# Patient Record
Sex: Male | Born: 1970 | Race: White | Hispanic: No | Marital: Married | Smoking: Former smoker
Health system: Southern US, Community
[De-identification: ages and names within clinical notes are randomized; demographics above are authoritative.]

## PROBLEM LIST (undated history)

## (undated) DIAGNOSIS — K219 Gastro-esophageal reflux disease without esophagitis: Secondary | ICD-10-CM

## (undated) DIAGNOSIS — R7989 Other specified abnormal findings of blood chemistry: Secondary | ICD-10-CM

## (undated) DIAGNOSIS — I10 Essential (primary) hypertension: Secondary | ICD-10-CM

## (undated) DIAGNOSIS — M1990 Unspecified osteoarthritis, unspecified site: Secondary | ICD-10-CM

## (undated) HISTORY — PX: WISDOM TOOTH EXTRACTION: SHX21

---

## 2012-11-24 ENCOUNTER — Other Ambulatory Visit (HOSPITAL_COMMUNITY): Payer: Self-pay | Admitting: Orthopaedic Surgery

## 2012-12-08 ENCOUNTER — Other Ambulatory Visit (HOSPITAL_COMMUNITY): Payer: Self-pay | Admitting: Orthopaedic Surgery

## 2012-12-14 ENCOUNTER — Other Ambulatory Visit: Payer: Self-pay | Admitting: Orthopedic Surgery

## 2012-12-29 ENCOUNTER — Inpatient Hospital Stay: Admit: 2012-12-29 | Payer: Self-pay | Admitting: Orthopedic Surgery

## 2012-12-29 SURGERY — ARTHROPLASTY, HIP, TOTAL, ANTERIOR APPROACH
Anesthesia: Choice | Site: Hip | Laterality: Right

## 2013-01-07 ENCOUNTER — Encounter (HOSPITAL_COMMUNITY): Payer: Self-pay | Admitting: Pharmacy Technician

## 2013-01-17 ENCOUNTER — Encounter (HOSPITAL_COMMUNITY)
Admission: RE | Admit: 2013-01-17 | Discharge: 2013-01-17 | Disposition: A | Discharge status: Home / Self Care | Payer: BC Managed Care – PPO | Source: Ambulatory Visit | Attending: Orthopaedic Surgery | Admitting: Orthopaedic Surgery

## 2013-01-17 ENCOUNTER — Encounter (HOSPITAL_COMMUNITY): Payer: Self-pay

## 2013-01-17 DIAGNOSIS — Z01818 Encounter for other preprocedural examination: Secondary | ICD-10-CM | POA: Insufficient documentation

## 2013-01-17 DIAGNOSIS — Z01812 Encounter for preprocedural laboratory examination: Secondary | ICD-10-CM | POA: Insufficient documentation

## 2013-01-17 HISTORY — DX: Unspecified osteoarthritis, unspecified site: M19.90

## 2013-01-17 HISTORY — DX: Gastro-esophageal reflux disease without esophagitis: K21.9

## 2013-01-17 HISTORY — DX: Other specified abnormal findings of blood chemistry: R79.89

## 2013-01-17 LAB — URINALYSIS, ROUTINE W REFLEX MICROSCOPIC
Bilirubin Urine: NEGATIVE
GLUCOSE, UA: NEGATIVE mg/dL
HGB URINE DIPSTICK: NEGATIVE
Ketones, ur: NEGATIVE mg/dL
LEUKOCYTES UA: NEGATIVE
Nitrite: NEGATIVE
PROTEIN: NEGATIVE mg/dL
SPECIFIC GRAVITY, URINE: 1.026 (ref 1.005–1.030)
Urobilinogen, UA: 0.2 mg/dL (ref 0.0–1.0)
pH: 6 (ref 5.0–8.0)

## 2013-01-17 LAB — CBC
HCT: 47.6 % (ref 39.0–52.0)
HEMOGLOBIN: 16.8 g/dL (ref 13.0–17.0)
MCH: 30.1 pg (ref 26.0–34.0)
MCHC: 35.3 g/dL (ref 30.0–36.0)
MCV: 85.2 fL (ref 78.0–100.0)
Platelets: 276 10*3/uL (ref 150–400)
RBC: 5.59 MIL/uL (ref 4.22–5.81)
RDW: 12.5 % (ref 11.5–15.5)
WBC: 8.2 10*3/uL (ref 4.0–10.5)

## 2013-01-17 LAB — PROTIME-INR
INR: 0.98 (ref 0.00–1.49)
PROTHROMBIN TIME: 12.8 s (ref 11.6–15.2)

## 2013-01-17 LAB — BASIC METABOLIC PANEL
BUN: 14 mg/dL (ref 6–23)
CHLORIDE: 98 meq/L (ref 96–112)
CO2: 25 mEq/L (ref 19–32)
Calcium: 9.9 mg/dL (ref 8.4–10.5)
Creatinine, Ser: 0.84 mg/dL (ref 0.50–1.35)
GFR calc non Af Amer: >90 mL/min (ref >90)
Glucose, Bld: 89 mg/dL (ref 70–99)
POTASSIUM: 4.2 meq/L (ref 3.7–5.3)
SODIUM: 134 meq/L — AB (ref 137–147)

## 2013-01-17 LAB — SURGICAL PCR SCREEN
MRSA, PCR: POSITIVE — AB
Staphylococcus aureus: POSITIVE — AB

## 2013-01-17 LAB — APTT: aPTT: 31 seconds (ref 24–37)

## 2013-01-17 LAB — ABO/RH: ABO/RH(D): O POS

## 2013-01-17 NOTE — Progress Notes (Signed)
01-17-13 1610 Pt. 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 With Mupirocin ointment-please call rx. To [facility] [phone].Reminder pt. Will require contact Isolation during stay.Noah Gonzalez

## 2013-01-17 NOTE — Pre-Procedure Instructions (Addendum)
01-17-13 1315Fax to PCP-Dr. Linda Hedges, [facility] sent info on Stop/Bang Score=4 with PAT appointment today. W.Analissa Bayless,RN 01-17-13 1610 pt. And Dr. Trevor Mace office made awre of Positive MRSA PCR screen-will need tx. Mupirocin and require contact isolation. W.Nashton Belson,RN

## 2013-01-17 NOTE — Patient Instructions (Addendum)
[address] RAIDON SWANNER  01/17/2013     01/17/2013   Your procedure is scheduled on:  1-9 -2015  Report to [facility] at     [place]   AM.  Call this number if you have problems the morning of surgery: [phone]  Or Presurgical Testing [phone](Marcelline Temkin) For Living Will and/or Health Care Power Attorney Forms: please provide copy for your medical record,may bring AM of surgery(Forms should be already notarized -we do not provide this service).  Remember: Follow any bowel prep instructions per MD office. For Cpap use: Bring mask and tubing only.   Do not eat food:After Midnight.   Take these medicines the morning of surgery with A SIP OF WATER: Ultram. Omeprazole.   Do not wear jewelry, make-up or nail polish.  Do not wear lotions, powders, or perfumes. You may wear deodorant.  Do not shave 12 hours prior to first CHG shower(legs and under arms).(face and neck okay.)  Do not bring valuables to the hospital.(Hospital is not responsible for lost valuables).  Contacts, dentures or removable bridgework, body piercing, hair pins may not be worn into surgery.  Leave suitcase in the car. After surgery it may be brought to your room.  For patients admitted to the hospital, checkout time is 11:00 AM the day of discharge.   Patients discharged the day of surgery will not be allowed to drive home. Must have responsible person with you x 24 hours once discharged.  Name and phone number of your driver: Lacy Duverney [phone] cell  Special Instructions: CHG(Chlorhedine 4%-"Hibiclens","Betasept","Aplicare") Shower Use Special Wash: see special instructions.(avoid face and genitals)   Please read over the following fact sheets that you were given: MRSA Information, Blood Transfusion fact sheet, Incentive Spirometry Instruction.  Remember : Type/Screen "Blue armbands" - may not be removed once applied(would result in being retested AM of surgery, if removed).  Failure to follow  these instructions may result in Cancellation of your surgery.   Patient signature_______________________________________________________

## 2013-01-17 NOTE — Progress Notes (Signed)
Your Pt has screened with an elevated risk for obstructive sleep apnea using the Stop-Bang tool during a presurgical  Visit. A score of four or greater is an elevated risk. 

## 2013-01-21 ENCOUNTER — Inpatient Hospital Stay (HOSPITAL_COMMUNITY): Payer: BC Managed Care – PPO | Admitting: Anesthesiology

## 2013-01-21 ENCOUNTER — Inpatient Hospital Stay (HOSPITAL_COMMUNITY): Payer: BC Managed Care – PPO

## 2013-01-21 ENCOUNTER — Encounter (HOSPITAL_COMMUNITY): Payer: Self-pay | Admitting: *Deleted

## 2013-01-21 ENCOUNTER — Encounter (HOSPITAL_COMMUNITY)
Admission: RE | Disposition: A | Discharge status: Home Health | Payer: Self-pay | Source: Ambulatory Visit | Attending: Orthopaedic Surgery

## 2013-01-21 ENCOUNTER — Encounter (HOSPITAL_COMMUNITY): Payer: BC Managed Care – PPO | Admitting: Anesthesiology

## 2013-01-21 ENCOUNTER — Inpatient Hospital Stay (HOSPITAL_COMMUNITY)
Admission: RE | Admit: 2013-01-21 | Discharge: 2013-01-22 | DRG: 470 | Disposition: A | Discharge status: Home Health | Payer: BC Managed Care – PPO | Source: Ambulatory Visit | Attending: Orthopaedic Surgery | Admitting: Orthopaedic Surgery

## 2013-01-21 DIAGNOSIS — Z87891 Personal history of nicotine dependence: Secondary | ICD-10-CM

## 2013-01-21 DIAGNOSIS — M1610 Unilateral primary osteoarthritis, unspecified hip: Principal | ICD-10-CM | POA: Diagnosis present

## 2013-01-21 DIAGNOSIS — Z79899 Other long term (current) drug therapy: Secondary | ICD-10-CM

## 2013-01-21 DIAGNOSIS — M169 Osteoarthritis of hip, unspecified: Principal | ICD-10-CM | POA: Diagnosis present

## 2013-01-21 DIAGNOSIS — K219 Gastro-esophageal reflux disease without esophagitis: Secondary | ICD-10-CM | POA: Diagnosis present

## 2013-01-21 DIAGNOSIS — Z96649 Presence of unspecified artificial hip joint: Secondary | ICD-10-CM

## 2013-01-21 HISTORY — PX: TOTAL HIP ARTHROPLASTY: SHX124

## 2013-01-21 LAB — TYPE AND SCREEN
ABO/RH(D): O POS
ANTIBODY SCREEN: NEGATIVE

## 2013-01-21 SURGERY — ARTHROPLASTY, HIP, TOTAL, ANTERIOR APPROACH
Anesthesia: Spinal | Site: Hip | Laterality: Right

## 2013-01-21 MED ORDER — METOCLOPRAMIDE HCL 10 MG PO TABS: 5.0000 mg | ORAL_TABLET | Freq: Three times a day (TID) | ORAL | Status: DC | PRN

## 2013-01-21 MED ORDER — ONDANSETRON HCL 4 MG/2ML IJ SOLN
INTRAMUSCULAR | Status: AC
  Filled 2013-01-21: qty 2

## 2013-01-21 MED ORDER — ASPIRIN EC 325 MG PO TBEC
325.0000 mg | DELAYED_RELEASE_TABLET | Freq: Two times a day (BID) | ORAL | Status: DC
  Administered 2013-01-21 – 2013-01-22 (×2): 325 mg via ORAL
  Filled 2013-01-21 (×4): qty 1

## 2013-01-21 MED ORDER — OMEPRAZOLE MAGNESIUM 20 MG PO TBEC: 20.0000 mg | DELAYED_RELEASE_TABLET | Freq: Every day | ORAL | Status: DC

## 2013-01-21 MED ORDER — ONDANSETRON HCL 4 MG/2ML IJ SOLN: 4.0000 mg | Freq: Four times a day (QID) | INTRAMUSCULAR | Status: DC | PRN

## 2013-01-21 MED ORDER — HYDROMORPHONE HCL PF 1 MG/ML IJ SOLN
INTRAMUSCULAR | Status: AC
  Filled 2013-01-21: qty 1

## 2013-01-21 MED ORDER — PROPOFOL 10 MG/ML IV BOLUS
INTRAVENOUS | Status: AC
  Filled 2013-01-21: qty 20

## 2013-01-21 MED ORDER — ALUM & MAG HYDROXIDE-SIMETH 200-200-20 MG/5ML PO SUSP: 30.0000 mL | ORAL | Status: DC | PRN

## 2013-01-21 MED ORDER — LACTATED RINGERS IV SOLN: INTRAVENOUS | Status: DC

## 2013-01-21 MED ORDER — PROPOFOL 10 MG/ML IV BOLUS
INTRAVENOUS | Status: DC | PRN
  Administered 2013-01-21 (×5): 20 mg via INTRAVENOUS

## 2013-01-21 MED ORDER — HYDROMORPHONE HCL PF 1 MG/ML IJ SOLN
1.0000 mg | INTRAMUSCULAR | Status: DC | PRN
  Administered 2013-01-21 (×4): 1 mg via INTRAVENOUS
  Filled 2013-01-21 (×4): qty 1

## 2013-01-21 MED ORDER — CEFAZOLIN SODIUM-DEXTROSE 2-3 GM-% IV SOLR
INTRAVENOUS | Status: AC
  Filled 2013-01-21: qty 50

## 2013-01-21 MED ORDER — PANTOPRAZOLE SODIUM 40 MG PO TBEC
40.0000 mg | DELAYED_RELEASE_TABLET | Freq: Every day | ORAL | Status: DC
  Administered 2013-01-21 – 2013-01-22 (×2): 40 mg via ORAL
  Filled 2013-01-21 (×2): qty 1

## 2013-01-21 MED ORDER — METHOCARBAMOL 500 MG PO TABS
500.0000 mg | ORAL_TABLET | Freq: Four times a day (QID) | ORAL | Status: DC | PRN
  Administered 2013-01-21 – 2013-01-22 (×2): 500 mg via ORAL
  Filled 2013-01-21 (×3): qty 1

## 2013-01-21 MED ORDER — DIPHENHYDRAMINE HCL 12.5 MG/5ML PO ELIX: 12.5000 mg | ORAL_SOLUTION | ORAL | Status: DC | PRN

## 2013-01-21 MED ORDER — HYDROMORPHONE HCL PF 1 MG/ML IJ SOLN
0.2500 mg | INTRAMUSCULAR | Status: DC | PRN
  Administered 2013-01-21 (×2): 0.5 mg via INTRAVENOUS

## 2013-01-21 MED ORDER — METHOCARBAMOL 100 MG/ML IJ SOLN
500.0000 mg | Freq: Four times a day (QID) | INTRAVENOUS | Status: DC | PRN
  Administered 2013-01-21 – 2013-01-22 (×3): 500 mg via INTRAVENOUS
  Filled 2013-01-21 (×3): qty 5

## 2013-01-21 MED ORDER — ONDANSETRON HCL 4 MG/2ML IJ SOLN
INTRAMUSCULAR | Status: DC | PRN
  Administered 2013-01-21: 4 mg via INTRAVENOUS

## 2013-01-21 MED ORDER — ACETAMINOPHEN 325 MG PO TABS
650.0000 mg | ORAL_TABLET | Freq: Four times a day (QID) | ORAL | Status: DC | PRN
  Administered 2013-01-22: 650 mg via ORAL
  Filled 2013-01-21: qty 2

## 2013-01-21 MED ORDER — MIDAZOLAM HCL 5 MG/5ML IJ SOLN
INTRAMUSCULAR | Status: DC | PRN
  Administered 2013-01-21: 2 mg via INTRAVENOUS

## 2013-01-21 MED ORDER — PHENYLEPHRINE HCL 10 MG/ML IJ SOLN
INTRAMUSCULAR | Status: DC | PRN
  Administered 2013-01-21 (×2): 40 ug via INTRAVENOUS

## 2013-01-21 MED ORDER — OXYCODONE HCL 5 MG/5ML PO SOLN
5.0000 mg | Freq: Once | ORAL | Status: DC | PRN
  Filled 2013-01-21: qty 5

## 2013-01-21 MED ORDER — TRANEXAMIC ACID 100 MG/ML IV SOLN
1000.0000 mg | INTRAVENOUS | Status: AC
  Administered 2013-01-21: 1000 mg via INTRAVENOUS
  Filled 2013-01-21: qty 10

## 2013-01-21 MED ORDER — PROPOFOL INFUSION 10 MG/ML OPTIME
INTRAVENOUS | Status: DC | PRN
  Administered 2013-01-21: 100 ug/kg/min via INTRAVENOUS

## 2013-01-21 MED ORDER — MEPERIDINE HCL 50 MG/ML IJ SOLN: 6.2500 mg | INTRAMUSCULAR | Status: DC | PRN

## 2013-01-21 MED ORDER — OXYCODONE HCL 5 MG PO TABS
5.0000 mg | ORAL_TABLET | ORAL | Status: DC | PRN
  Administered 2013-01-21 – 2013-01-22 (×6): 10 mg via ORAL
  Filled 2013-01-21 (×6): qty 2

## 2013-01-21 MED ORDER — PHENYLEPHRINE 40 MCG/ML (10ML) SYRINGE FOR IV PUSH (FOR BLOOD PRESSURE SUPPORT)
PREFILLED_SYRINGE | INTRAVENOUS | Status: AC
  Filled 2013-01-21: qty 10

## 2013-01-21 MED ORDER — KETOROLAC TROMETHAMINE 30 MG/ML IJ SOLN
30.0000 mg | Freq: Once | INTRAMUSCULAR | Status: AC
  Administered 2013-01-21: 30 mg via INTRAVENOUS

## 2013-01-21 MED ORDER — BUPIVACAINE HCL (PF) 0.5 % IJ SOLN
INTRAMUSCULAR | Status: AC
  Filled 2013-01-21: qty 30

## 2013-01-21 MED ORDER — OXYCODONE HCL 5 MG PO TABS: 5.0000 mg | ORAL_TABLET | Freq: Once | ORAL | Status: DC | PRN

## 2013-01-21 MED ORDER — POLYETHYLENE GLYCOL 3350 17 G PO PACK: 17.0000 g | PACK | Freq: Every day | ORAL | Status: DC | PRN

## 2013-01-21 MED ORDER — CEFAZOLIN SODIUM-DEXTROSE 2-3 GM-% IV SOLR
2.0000 g | INTRAVENOUS | Status: AC
  Administered 2013-01-21: 2 g via INTRAVENOUS

## 2013-01-21 MED ORDER — ACETAMINOPHEN 650 MG RE SUPP: 650.0000 mg | Freq: Four times a day (QID) | RECTAL | Status: DC | PRN

## 2013-01-21 MED ORDER — MIDAZOLAM HCL 2 MG/2ML IJ SOLN
INTRAMUSCULAR | Status: AC
  Filled 2013-01-21: qty 2

## 2013-01-21 MED ORDER — ZOLPIDEM TARTRATE 5 MG PO TABS: 5.0000 mg | ORAL_TABLET | Freq: Every evening | ORAL | Status: DC | PRN

## 2013-01-21 MED ORDER — BUPIVACAINE HCL (PF) 0.5 % IJ SOLN
INTRAMUSCULAR | Status: DC | PRN
  Administered 2013-01-21: 3 mL

## 2013-01-21 MED ORDER — ONDANSETRON HCL 4 MG PO TABS: 4.0000 mg | ORAL_TABLET | Freq: Four times a day (QID) | ORAL | Status: DC | PRN

## 2013-01-21 MED ORDER — SODIUM CHLORIDE 0.9 % IV SOLN
INTRAVENOUS | Status: DC
  Administered 2013-01-21 – 2013-01-22 (×2): via INTRAVENOUS

## 2013-01-21 MED ORDER — METOCLOPRAMIDE HCL 5 MG/ML IJ SOLN: 5.0000 mg | Freq: Three times a day (TID) | INTRAMUSCULAR | Status: DC | PRN

## 2013-01-21 MED ORDER — PHENOL 1.4 % MT LIQD
1.0000 | OROMUCOSAL | Status: DC | PRN
  Filled 2013-01-21: qty 177

## 2013-01-21 MED ORDER — VANCOMYCIN HCL IN DEXTROSE 1-5 GM/200ML-% IV SOLN
1000.0000 mg | Freq: Two times a day (BID) | INTRAVENOUS | Status: AC
  Administered 2013-01-21: 1000 mg via INTRAVENOUS
  Filled 2013-01-21: qty 200

## 2013-01-21 MED ORDER — DOCUSATE SODIUM 100 MG PO CAPS
100.0000 mg | ORAL_CAPSULE | Freq: Two times a day (BID) | ORAL | Status: DC
  Administered 2013-01-21 – 2013-01-22 (×3): 100 mg via ORAL

## 2013-01-21 MED ORDER — LACTATED RINGERS IV SOLN
INTRAVENOUS | Status: DC | PRN
  Administered 2013-01-21 (×3): via INTRAVENOUS

## 2013-01-21 MED ORDER — FENTANYL CITRATE 0.05 MG/ML IJ SOLN
INTRAMUSCULAR | Status: AC
  Filled 2013-01-21: qty 2

## 2013-01-21 MED ORDER — MENTHOL 3 MG MT LOZG
1.0000 | LOZENGE | OROMUCOSAL | Status: DC | PRN
  Filled 2013-01-21: qty 9

## 2013-01-21 MED ORDER — KETOROLAC TROMETHAMINE 30 MG/ML IJ SOLN
INTRAMUSCULAR | Status: AC
  Filled 2013-01-21: qty 1

## 2013-01-21 MED ORDER — PROMETHAZINE HCL 25 MG/ML IJ SOLN: 6.2500 mg | INTRAMUSCULAR | Status: DC | PRN

## 2013-01-21 MED ORDER — SODIUM CHLORIDE 0.9 % IR SOLN
Status: DC | PRN
  Administered 2013-01-21: 1000 mL

## 2013-01-21 SURGICAL SUPPLY — 40 items
BAG ZIPLOCK 12X15 (MISCELLANEOUS) IMPLANT
BENZOIN TINCTURE PRP APPL 2/3 (GAUZE/BANDAGES/DRESSINGS) ×2 IMPLANT
BLADE SAW SGTL 18X1.27X75 (BLADE) ×2 IMPLANT
CAPT HIP PF COP ×2 IMPLANT
CELLS DAT CNTRL 66122 CELL SVR (MISCELLANEOUS) ×1 IMPLANT
DERMABOND ADVANCED (GAUZE/BANDAGES/DRESSINGS)
DERMABOND ADVANCED .7 DNX12 (GAUZE/BANDAGES/DRESSINGS) IMPLANT
DRAPE C-ARM 42X120 X-RAY (DRAPES) ×2 IMPLANT
DRAPE STERI IOBAN 125X83 (DRAPES) ×2 IMPLANT
DRAPE U-SHAPE 47X51 STRL (DRAPES) ×6 IMPLANT
DRSG AQUACEL AG ADV 3.5X10 (GAUZE/BANDAGES/DRESSINGS) ×2 IMPLANT
DURAPREP 26ML APPLICATOR (WOUND CARE) ×2 IMPLANT
ELECT BLADE TIP CTD 4 INCH (ELECTRODE) ×2 IMPLANT
ELECT REM PT RETURN 9FT ADLT (ELECTROSURGICAL) ×2
ELECTRODE REM PT RTRN 9FT ADLT (ELECTROSURGICAL) ×1 IMPLANT
FACESHIELD LNG OPTICON STERILE (SAFETY) ×8 IMPLANT
GAUZE XEROFORM 5X9 LF (GAUZE/BANDAGES/DRESSINGS) IMPLANT
GLOVE BIO SURGEON STRL SZ7.5 (GLOVE) ×16 IMPLANT
GLOVE BIOGEL PI IND STRL 8 (GLOVE) ×2 IMPLANT
GLOVE BIOGEL PI INDICATOR 8 (GLOVE) ×2
GLOVE ECLIPSE 8.0 STRL XLNG CF (GLOVE) ×2 IMPLANT
GOWN STRL REUS W/TWL XL LVL3 (GOWN DISPOSABLE) ×8 IMPLANT
HANDPIECE INTERPULSE COAX TIP (DISPOSABLE) ×1
KIT BASIN OR (CUSTOM PROCEDURE TRAY) ×2 IMPLANT
PACK TOTAL JOINT (CUSTOM PROCEDURE TRAY) ×2 IMPLANT
PADDING CAST COTTON 6X4 STRL (CAST SUPPLIES) ×2 IMPLANT
RTRCTR WOUND ALEXIS 18CM MED (MISCELLANEOUS) ×2
SET HNDPC FAN SPRY TIP SCT (DISPOSABLE) ×1 IMPLANT
STAPLER VISISTAT 35W (STAPLE) IMPLANT
STRIP CLOSURE SKIN 1/2X4 (GAUZE/BANDAGES/DRESSINGS) ×2 IMPLANT
SUT ETHIBOND NAB CT1 #1 30IN (SUTURE) ×4 IMPLANT
SUT ETHILON 3 0 PS 1 (SUTURE) IMPLANT
SUT MNCRL AB 4-0 PS2 18 (SUTURE) ×2 IMPLANT
SUT VIC AB 0 CT1 36 (SUTURE) ×2 IMPLANT
SUT VIC AB 1 CT1 36 (SUTURE) ×4 IMPLANT
SUT VIC AB 2-0 CT1 27 (SUTURE) ×2
SUT VIC AB 2-0 CT1 TAPERPNT 27 (SUTURE) ×2 IMPLANT
TOWEL OR 17X26 10 PK STRL BLUE (TOWEL DISPOSABLE) ×2 IMPLANT
TOWEL OR NON WOVEN STRL DISP B (DISPOSABLE) ×2 IMPLANT
TRAY FOLEY CATH 14FRSI W/METER (CATHETERS) ×2 IMPLANT

## 2013-01-21 NOTE — Progress Notes (Signed)
Dr. Lissa Hoard notified of patient complaining of right shoulder discomfort

## 2013-01-21 NOTE — Progress Notes (Signed)
Patient states right shoulder now better.

## 2013-01-21 NOTE — Anesthesia Procedure Notes (Addendum)
Spinal  Patient location during procedure: OR Start time: 01/21/2013 7:40 AM End time: 01/21/2013 7:48 AM Staffing CRNA/Resident: Anne Fu Performed by: resident/CRNA  Preanesthetic Checklist Completed: patient identified, site marked, surgical consent, pre-op evaluation, timeout performed, IV checked, risks and benefits discussed and monitors and equipment checked Spinal Block Patient position: sitting Prep: ChloraPrep Patient monitoring: heart rate, continuous pulse ox and blood pressure Approach: midline Location: L2-3 Injection technique: single-shot Needle Needle type: Sprotte  Needle gauge: 24 G Needle length: 9 cm Assessment Sensory level: T4 Additional Notes Expiration date of kit checked and confirmed. Patient tolerated procedure well, without complications.  Spinal X 1 attempt with noted clear CSF return easy aspiration and administration of medication. Noted T4 level on exam.

## 2013-01-21 NOTE — Progress Notes (Signed)
Dr. Lissa Hoard  Aware of spinal level and that patient is trying to move both legs- some movement- spinal level of L3

## 2013-01-21 NOTE — Anesthesia Postprocedure Evaluation (Signed)
Anesthesia Post Note  Patient: Noah Gonzalez  Procedure(s) Performed: Procedure(s) (LRB): RIGHT TOTAL HIP ARTHROPLASTY ANTERIOR APPROACH (Right)  Anesthesia type: Spinal  Patient location: PACU  Post pain: Pain level controlled  Post assessment: Post-op Vital signs reviewed  Last Vitals: BP 114/67  Pulse 73  Temp(Src) 37.1 C (Oral)  Resp 14  Ht 5\' 9"  (1.753 m)  Wt 244 lb 11.4 oz (111 kg)  BMI 36.12 kg/m2  SpO2 97%  Post vital signs: Reviewed  Level of consciousness: sedated  Complications: No apparent anesthesia complications

## 2013-01-21 NOTE — Evaluation (Signed)
Physical Therapy Evaluation Patient Details Name: Noah Gonzalez MRN: [id number] DOB: 1971/01/05 Today's Date: 01/21/2013 Time: 6720-9470 PT Time Calculation (min): 30 min  PT Assessment / Plan / Recommendation History of Present Illness     Clinical Impression  Pt s/p R THR presents with decreased R LE strength/ROM and post op pain limiting functional mobility.  Pt should progress to d/c home with family assist and HHPT follow up.    PT Assessment  Patient needs continued PT services    Follow Up Recommendations  Home health PT    Does the patient have the potential to tolerate intense rehabilitation      Barriers to Discharge        Equipment Recommendations  Rolling walker with 5" wheels    Recommendations for Other Services OT consult   Frequency 7X/week    Precautions / Restrictions Precautions Precautions: Fall Restrictions Weight Bearing Restrictions: No Other Position/Activity Restrictions: WBAT   Pertinent Vitals/Pain 6/10; premed, ice packs provided      Mobility  Bed Mobility Overal bed mobility: Needs Assistance Bed Mobility: Supine to Sit Supine to sit: Min assist General bed mobility comments: cues for sequence with min assist for R LE management Transfers Overall transfer level: Needs assistance Equipment used: Rolling walker (2 wheeled) Transfers: Sit to/from Stand Sit to Stand: Min assist General transfer comment: cues for LE management and use of UEs to self assist Ambulation/Gait Ambulation/Gait assistance: Min assist Ambulation Distance (Feet): 150 Feet Assistive device: Rolling walker (2 wheeled) Gait Pattern/deviations: Step-to pattern;Shuffle;Decreased step length - right;Decreased step length - left Gait velocity: decr General Gait Details: cues for posture, position from RW, stride length and initial sequence    Exercises Total Joint Exercises Ankle Circles/Pumps: AROM;15 reps;Both;Supine Quad Sets: AROM;Both;10 reps;Supine Heel  Slides: AAROM;20 reps;Right;Supine Hip ABduction/ADduction: AAROM;15 reps;Supine;Right   PT Diagnosis: Difficulty walking  PT Problem List: Decreased strength;Decreased range of motion;Decreased activity tolerance;Decreased mobility;Decreased knowledge of use of DME;Obesity;Pain PT Treatment Interventions: DME instruction;Gait training;Stair training;Functional mobility training;Therapeutic activities;Therapeutic exercise;Patient/family education     PT Goals(Current goals can be found in the care plan section) Acute Rehab PT Goals Patient Stated Goal: Be able to surf again PT Goal Formulation: With patient Time For Goal Achievement: 01/28/13 Potential to Achieve Goals: Good  Visit Information  Last PT Received On: 01/21/13 Assistance Needed: +1       Prior [place] expects to be discharged to:: Private residence Living Arrangements: Spouse/significant other Available Help at Discharge: Family Type of Home: House Home Access: Stairs to enter Technical brewer of Steps: 3 Entrance Stairs-Rails: Left Home Layout: Able to live on main level with bedroom/bathroom Home Equipment: Crutches Prior Function Level of Independence: Independent Communication Communication: No difficulties Dominant Hand: Right    Cognition  Cognition Arousal/Alertness: Awake/alert Behavior During Therapy: WFL for tasks assessed/performed Overall Cognitive Status: Within Functional Limits for tasks assessed    Extremity/Trunk Assessment Upper Extremity Assessment Upper Extremity Assessment: Overall WFL for tasks assessed Lower Extremity Assessment Lower Extremity Assessment: RLE deficits/detail RLE Deficits / Details: Hip strength 2+/5 with AAROM at hip to 90 flex and 25 abd Cervical / Trunk Assessment Cervical / Trunk Assessment: Normal   Balance    End of Session PT - End of Session Equipment Utilized During Treatment: Gait belt Activity Tolerance: Patient  tolerated treatment well Patient left: with call bell/phone within reach;in bed Nurse Communication: Mobility status  GP     Cambrie Sonnenfeld 01/21/2013, 5:26 PM

## 2013-01-21 NOTE — Transfer of Care (Signed)
Immediate Anesthesia Transfer of Care Note  Patient: Noah Gonzalez  Procedure(s) Performed: Procedure(s) (LRB): RIGHT TOTAL HIP ARTHROPLASTY ANTERIOR APPROACH (Right)  Patient Location: PACU  Anesthesia Type: Spinal  Level of Consciousness: sedated, patient cooperative and responds to stimulation  Airway & Oxygen Therapy: Patient Spontanous Breathing and Patient connected to face mask oxgen  Post-op Assessment: Report given to PACU RN and Post -op Vital signs reviewed and stable  Post vital signs: Reviewed and stable, noted T-12 level on exam in PACU denied pain on assessment.   Complications: No apparent anesthesia complications

## 2013-01-21 NOTE — Progress Notes (Signed)
X-RAY Results noted.

## 2013-01-21 NOTE — Progress Notes (Signed)
Portable AP Pelvis and Lateral Right Hip X-rays done. 

## 2013-01-21 NOTE — Brief Op Note (Signed)
01/21/2013  9:25 AM  PATIENT:  Cassell Smiles  43 y.o. male  PRE-OPERATIVE DIAGNOSIS:  Right hip osteoarthritis  POST-OPERATIVE DIAGNOSIS:  right hip osteoarthritis  PROCEDURE:  Procedure(s): RIGHT TOTAL HIP ARTHROPLASTY ANTERIOR APPROACH (Right)  SURGEON:  Surgeon(s) and Role:    * Mcarthur Rossetti, MD - Primary  PHYSICIAN ASSISTANT: Benita Stabile, PA-C  ANESTHESIA:   spinal  EBL:  Total I/O In: 1000 [I.V.:1000] Out: 400 [Urine:100; Blood:300]  BLOOD ADMINISTERED:none  DRAINS: none   LOCAL MEDICATIONS USED:  NONE  SPECIMEN:  No Specimen  DISPOSITION OF SPECIMEN:  N/A  COUNTS:  YES  TOURNIQUET:  * No tourniquets in log *  DICTATION: .Other Dictation: Dictation Number [id number]  PLAN OF CARE: Admit to inpatient   PATIENT DISPOSITION:  PACU - hemodynamically stable.   Delay start of Pharmacological VTE agent (>24hrs) due to surgical blood loss or risk of bleeding: no

## 2013-01-21 NOTE — H&P (Signed)
TOTAL HIP ADMISSION H&P  Patient is admitted for right total hip arthroplasty.  Subjective:  Chief Complaint: right hip pain  HPI: Noah Gonzalez, 43 y.o. male, has a history of pain and functional disability in the right hip(s) due to arthritis and patient has failed non-surgical conservative treatments for greater than 12 weeks to include NSAID's and/or analgesics, corticosteriod injections, use of assistive devices, weight reduction as appropriate and activity modification.  Onset of symptoms was gradual starting 5 years ago with rapidlly worsening course since that time.The patient noted no past surgery on the right hip(s).  Patient currently rates pain in the right hip at 10 out of 10 with activity. Patient has night pain, worsening of pain with activity and weight bearing, trendelenberg gait, pain that interfers with activities of daily living and pain with passive range of motion. Patient has evidence of subchondral cysts, subchondral sclerosis, periarticular osteophytes and joint space narrowing by imaging studies. This condition presents safety issues increasing the risk of falls.  There is no current active infection.  Patient Active Problem List   Diagnosis Date Noted  . Arthritis pain of right hip 01/21/2013   Past Medical History  Diagnosis Date  . GERD (gastroesophageal reflux disease)   . Arthritis     osteoarthritis-hips  . Low serum testosterone level     recent tx. done    Past Surgical History  Procedure Laterality Date  . Wisdom tooth extraction      Prescriptions prior to admission  Medication Sig Dispense Refill  . MELATONIN PO Take 1 tablet by mouth at bedtime.      Marland Kitchen testosterone cypionate (DEPOTESTOTERONE CYPIONATE) 200 MG/ML injection Inject into the muscle every 14 (fourteen) days.      . traMADol (ULTRAM) 50 MG tablet Take 50 mg by mouth every 6 (six) hours as needed for moderate pain or severe pain.      Marland Kitchen omeprazole (PRILOSEC OTC) 20 MG tablet Take 20 mg  by mouth daily. takes bedtime       No Known Allergies  History  Substance Use Topics  . Smoking status: Former Smoker -- 2 years    Types: Cigarettes    Quit date: 01/17/2005  . Smokeless tobacco: Former Systems developer    Types: Chew     Comment: quit 6 yrs ago  . Alcohol Use: Yes     Comment: rare social    History reviewed. No pertinent family history.   Review of Systems  Musculoskeletal: Positive for joint pain.  All other systems reviewed and are negative.    Objective:  Physical Exam  Constitutional: He is oriented to person, place, and time. He appears well-developed and well-nourished.  HENT:  Head: Normocephalic and atraumatic.  Eyes: EOM are normal. Pupils are equal, round, and reactive to light.  Neck: Normal range of motion. Neck supple.  Cardiovascular: Normal rate and regular rhythm.   Respiratory: Effort normal and breath sounds normal.  GI: Soft. Bowel sounds are normal.  Musculoskeletal:       Right hip: He exhibits decreased range of motion, decreased strength and bony tenderness.  Neurological: He is alert and oriented to person, place, and time.  Skin: Skin is warm and dry.  Psychiatric: He has a normal mood and affect.    Vital signs in last 24 hours: Temp:  [99.1 F (37.3 C)] 99.1 F (37.3 C) (01/09 0545) Pulse Rate:  [72] 72 (01/09 0545) Resp:  [16] 16 (01/09 0545) BP: (146)/(88) 146/88 mmHg (01/09 0545)  SpO2:  [96 %] 96 % (01/09 0545)  Labs:   There is no weight on file to calculate BMI.   Imaging Review Plain radiographs demonstrate severe degenerative joint disease of the right hip(s). The bone quality appears to be excellent for age and reported activity level.  Assessment/Plan:  End stage arthritis, right hip(s)  The patient history, physical examination, clinical judgement of the provider and imaging studies are consistent with end stage degenerative joint disease of the right hip(s) and total hip arthroplasty is deemed medically  necessary. The treatment options including medical management, injection therapy, arthroscopy and arthroplasty were discussed at length. The risks and benefits of total hip arthroplasty were presented and reviewed. The risks due to aseptic loosening, infection, stiffness, dislocation/subluxation,  thromboembolic complications and other imponderables were discussed.  The patient acknowledged the explanation, agreed to proceed with the plan and consent was signed. Patient is being admitted for inpatient treatment for surgery, pain control, PT, OT, prophylactic antibiotics, VTE prophylaxis, progressive ambulation and ADL's and discharge planning.The patient is planning to be discharged home with home health services

## 2013-01-21 NOTE — Anesthesia Preprocedure Evaluation (Signed)
Anesthesia Evaluation  Patient identified by MRN, date of birth, ID band Patient awake    Reviewed: Allergy & Precautions, H&P , NPO status , Patient's Chart, lab work & pertinent test results  Airway Mallampati: II TM Distance: >3 FB Neck ROM: Full    Dental  (+) Dental Advisory Given   Pulmonary former smoker,  breath sounds clear to auscultation        Cardiovascular negative cardio ROS  Rhythm:Regular Rate:Normal     Neuro/Psych negative neurological ROS  negative psych ROS   GI/Hepatic Neg liver ROS, GERD-  Medicated,  Endo/Other  negative endocrine ROS  Renal/GU negative Renal ROS     Musculoskeletal negative musculoskeletal ROS (+)   Abdominal   Peds  Hematology negative hematology ROS (+)   Anesthesia Other Findings   Reproductive/Obstetrics negative OB ROS                           Anesthesia Physical Anesthesia Plan  ASA: II  Anesthesia Plan: Spinal   Post-op Pain Management:    Induction: Intravenous  Airway Management Planned:   Additional Equipment:   Intra-op Plan:   Post-operative Plan:   Informed Consent: I have reviewed the patients History and Physical, chart, labs and discussed the procedure including the risks, benefits and alternatives for the proposed anesthesia with the patient or authorized representative who has indicated his/her understanding and acceptance.   Dental advisory given  Plan Discussed with: CRNA  Anesthesia Plan Comments:         Anesthesia Quick Evaluation

## 2013-01-21 NOTE — Progress Notes (Signed)
Dr. Lissa Hoard in and talked with patient.

## 2013-01-21 NOTE — Progress Notes (Signed)
patient states doctor reviewed risk/benefits of blood transfusion consent signed.

## 2013-01-22 LAB — CBC
HCT: 39.3 % (ref 39.0–52.0)
Hemoglobin: 13.5 g/dL (ref 13.0–17.0)
MCH: 30 pg (ref 26.0–34.0)
MCHC: 34.4 g/dL (ref 30.0–36.0)
MCV: 87.3 fL (ref 78.0–100.0)
Platelets: 237 10*3/uL (ref 150–400)
RBC: 4.5 MIL/uL (ref 4.22–5.81)
RDW: 12.6 % (ref 11.5–15.5)
WBC: 11.6 10*3/uL — ABNORMAL HIGH (ref 4.0–10.5)

## 2013-01-22 LAB — BASIC METABOLIC PANEL
BUN: 10 mg/dL (ref 6–23)
CALCIUM: 8.7 mg/dL (ref 8.4–10.5)
CO2: 27 mEq/L (ref 19–32)
Chloride: 95 mEq/L — ABNORMAL LOW (ref 96–112)
Creatinine, Ser: 0.95 mg/dL (ref 0.50–1.35)
Glucose, Bld: 98 mg/dL (ref 70–99)
Potassium: 4.1 mEq/L (ref 3.7–5.3)
SODIUM: 133 meq/L — AB (ref 137–147)

## 2013-01-22 MED ORDER — OXYCODONE-ACETAMINOPHEN 5-325 MG PO TABS: 1.0000 | ORAL_TABLET | ORAL | Status: DC | PRN

## 2013-01-22 MED ORDER — ASPIRIN 325 MG PO TBEC: 325.0000 mg | DELAYED_RELEASE_TABLET | Freq: Two times a day (BID) | ORAL | Status: DC

## 2013-01-22 MED ORDER — METHOCARBAMOL 500 MG PO TABS: 1000.0000 mg | ORAL_TABLET | Freq: Four times a day (QID) | ORAL | Status: DC | PRN

## 2013-01-22 NOTE — Op Note (Signed)
Noah Gonzalez, Noah Gonzalez NO.:  [id number]  MEDICAL RECORD NO.:  [id number]  LOCATION:  7741                         FACILITY:  [facility]  PHYSICIAN:  Lind Guest. Ninfa Linden, M.D.DATE OF BIRTH:  1970-09-17  DATE OF PROCEDURE:  01/21/2013 DATE OF DISCHARGE:                              OPERATIVE REPORT   PREOPERATIVE DIAGNOSES:  Severe end-stage arthritis and degenerative joint disease, right hip.  POSTOPERATIVE DIAGNOSES:  Severe end-stage arthritis and degenerative joint disease, right hip.  PROCEDURE:  Right total hip arthroplasty through direct anterior approach.  IMPLANTS:  DePuy Sector Gription acetabular component size 52 with apex hole eliminator guide and single screw, size 36+ 4 neutral polyethylene liner, size 11 Corail femoral component with varus offset (KLA), size 36+ 5 ceramic hip ball.  SURGEON:  Lind Guest. Ninfa Linden, M.D.  ASSISTANT:  Erskine Emery, PA-C.  ANESTHESIA:  Spinal.  ANTIBIOTICS:  2 g IV Ancef.  BLOOD LOSS:  300 mL.  COMPLICATIONS:  None.  INDICATIONS:  [name] is only a 43 year old gentleman with well documented evidence of severe end-stage arthritis of both his hips.  He has complete loss of the joint space on x-rays of both hips, periarticular osteophytes, subchondral sclerosis, and cystic changes. As the pain is more debilitating in the left than his right.  At this point, with the failure of conservative treatment, he wished to proceed with a total hip arthroplasty.  He understands the risks of this related to his age in terms of the implants pouring out.  He understands the risk of nerve and vessel injury, acute blood loss anemia, infection, fracture, dislocation and DVT.  He also understands the goals are improved mobility, decreased pain, and overall improved quality of life.  PROCEDURE DESCRIPTION:  After informed consent was obtained, appropriate right hip was marked.  He was brought to the operating room, a  spinal anesthesia was obtained while he was on the stretcher.  He was then placed in supine position on the stretcher and a Foley catheter was placed and traction boots were placed on both his feet.  Next, he was placed supine on the Hana fracture table with the perineal post in place and both legs in inline skeletal traction devices, but no traction applied.  His right operative hip was then prepped and draped with DuraPrep and sterile drapes.  A time-out was called to identify correct patient, correct right hip.  I then made an incision just inferior and posterior to the anterior-superior iliac spine and carried this slightly obliquely down the leg.  I dissected down to the tensor fascia lata and the tensor fascia was divided longitudinally.  I then proceeded with a direct anterior approach to the hip.  A Cobra retractor was placed around the lateral neck and around the medial neck, and I cauterized the lateral femoral circumflex vessels.  I opened up the hip capsule and found a large hip joint effusion, and placed the Cobra retractors within the hip capsule.  I then made my femoral neck cut with an oscillating saw just proximal to the lesser trochanter and completed this with an osteotome.  I placed a corkscrew guide in the femoral head and removed the femoral head  in its entirety and found a completely devoid of cartilage consistent with osteoarthritis and not avascular necrosis again given his age, which was quite severe.  I then cleaned the acetabulum of debris and placed a bent Hohmann medially and a Cobra retractor laterally.  I then began reaming from a size 42 reamer all the way up to a size 52 with all reamers under direct visualization and the last reamer under direct fluoroscopy, so we could also obtain our depth of reaming, our inclination, and anteversion.  Once I was pleased with this, I placed the real DePuy Sector Gription acetabular component size 52, the apex hole  eliminator guide and a single screw.  I then placed the real 36+ 4 neutral polyethylene liner and attention was turned to under the femur with the leg externally rotated to 100 degrees, extended and adducted.  I placed a Mueller retractor medially and a Holman retractor behind the greater trochanter.  I used a box cutting osteotome in the femoral canal and a rongeur to lateralize.  I then began broaching using the Corail broaching system from a size 8 up to a size 11.  With a size 11 and based on his anatomy, I tried a varus offset femoral neck and a 36+ 1.5 hip ball.  We brought the leg back over and up with traction and internal rotation reduced in the pelvis and showed just a slight bit of shock, but it was stable and external rotation past 90 degrees, but his leg lengths showed he was slightly short, so we decided to go with a longer hip ball.  We dislocated the hip and removed the trial components.  We then placed the real Corail femoral component size 11 with varus offset and the real 36+ 5 ceramic hip ball.  We reduced this back in the acetabulum and it was stable.  We copiously irrigated the soft tissue with normal saline solution using pulsatile lavage.  We closed the joint capsule with #1 Ethibond suture, followed by a running #1 Vicryl in the tensor fascia, 0 Vicryl in the deep tissue, 2-0 Vicryl in the subcutaneous tissue, and a 4-0 Monocryl subcuticular stitch.  Steri-Strips were applied as well as an Aquacel dressing.  He was taken off the Hana table into the recovery room in stable condition.  .  All final counts were correct.  There were no complications noted.  Of note, Erskine Emery, PA-C, was present during the entire case and his assistance was crucial for getting the case facilitated and completed throughout the case.     Lind Guest. Ninfa Linden, M.D.     CYB/MEDQ  D:  01/21/2013  T:  01/22/2013  Job:  [id number]

## 2013-01-22 NOTE — Evaluation (Signed)
Occupational Therapy Evaluation Patient Details Name: Noah Gonzalez MRN: [id number] DOB: Feb 18, 1970 Today's Date: 01/22/2013 Time: 1937-9024 OT Time Calculation (min): 18 min  OT Assessment / Plan / Recommendation History of present illness THA   Clinical Impression   Spasms limiting pt.  Education  Complete. Pt able to verbalize understanding of ADL activity s/p THA    OT Assessment  Patient does not need any further OT services    Follow Up Recommendations  No OT follow up       Equipment Recommendations  3 in 1 bedside comode          Precautions / Restrictions Restrictions Weight Bearing Restrictions: No Other Position/Activity Restrictions: WBAT       ADL  ADL Comments: Pt has been complaining of spasms.Pt was comfortable and did not want to move, but was open to OT at bed level. Pt was unable to dress him self prior to surgery and was open to using AE for dressing. Pt was educated on use of AE for dressing - reacher and socks.  OT did instruct pt where he could obtain and he plans on obtaining. Pt does want a 3 n 1 , but does feel his tub is such he can sit on side that is wider and get in tub.           Visit Information  Last OT Received On: 01/22/13 Assistance Needed: +1 History of Present Illness: THA       Prior [place] expects to be discharged to:: Private residence Living Arrangements: Spouse/significant other Available Help at Discharge: Family Type of Home: House Home Access: Stairs to enter Technical brewer of Steps: 3 Entrance Stairs-Rails: Left Home Layout: Able to live on main level with bedroom/bathroom Home Equipment: Crutches Prior Function Level of Independence: Independent Communication Communication: No difficulties Dominant Hand: Right         Vision/Perception Vision - History Patient Visual Report: No change from baseline   Cognition  Cognition Arousal/Alertness:  Awake/alert Behavior During Therapy: WFL for tasks assessed/performed Overall Cognitive Status: Within Functional Limits for tasks assessed    Extremity/Trunk Assessment Upper Extremity Assessment Upper Extremity Assessment: Overall WFL for tasks assessed     Mobility Bed Mobility Overal bed mobility: Needs Assistance Bed Mobility: Sit to Supine Supine to sit: Min assist General bed mobility comments: assit to help lift RLE for causes pain in R hip area Transfers Overall transfer level: Modified independent Equipment used: Rolling walker (2 wheeled) Transfers: Sit to/from Stand Sit to Stand: Modified independent (Device/Increase time)           End of Session OT - End of Session Activity Tolerance: Patient limited by pain;Other (comment) (spasms) Patient left: in bed;with family/visitor present  GO     Brilliant, Thereasa Parkin 01/22/2013, 2:12 PM

## 2013-01-22 NOTE — Progress Notes (Signed)
Pt stable, scripts, d/c instructions, and equipment given with no questions/concerns voiced by pt or wife.  Pt transported via wheelchair to private vehicle by NT and family.

## 2013-01-22 NOTE — Progress Notes (Signed)
Physical Therapy Treatment Patient Details Name: Noah Gonzalez MRN: [id number] DOB: Oct 19, 1970 Today's Date: 01/22/2013 Time: 2500-3704 PT Time Calculation (min): 45 min  PT Assessment / Plan / Recommendation  History of Present Illness R direct THA    PT Comments   Pt tolerated session well , did a lot of verbal education with car transfer, how to progress with exercises. Reviewed verbally and with demonstration on exercises as well as stair training. Handout given. Pt feels great with progression  And really emphasized the rest and healing phase as well. Reviewed some quad stretches gently with avoiding too much hip extension and no external rotation. Pt aware and agree. Pt safe to DC home from our standpoint.   Follow Up Recommendations  Home health PT     Does the patient have the potential to tolerate intense rehabilitation     Barriers to Discharge        Equipment Recommendations  Rolling walker with 5" wheels    Recommendations for Other Services    Frequency 7X/week   Progress towards PT Goals    Plan      Precautions / Restrictions Restrictions Weight Bearing Restrictions: No Other Position/Activity Restrictions: WBAT   Pertinent Vitals/Pain Had lots of pian last night, today much better about 4/10 , just "feel tight" in quad and around incision.     Mobility  Bed Mobility Overal bed mobility: Needs Assistance Bed Mobility: Sit to Supine Supine to sit: Min assist General bed mobility comments: assit to help lift RLE for causes pain in R hip area Transfers Overall transfer level: Modified independent Equipment used: Rolling walker (2 wheeled) Transfers: Sit to/from Stand Sit to Stand: Modified independent (Device/Increase time) Ambulation/Gait Ambulation/Gait assistance: Supervision Ambulation Distance (Feet): 150 Feet Assistive device: Rolling walker (2 wheeled) Gait Pattern/deviations: Step-to pattern Gait velocity: decr General Gait Details: decreased  hip flesion due to pain with hip flexion in swing phase. Had internally popping sensation and he asked MD in hallway concerning this and states it should get bettter with time and no worries at this time.     Exercises     PT Diagnosis:    PT Problem List:   PT Treatment Interventions:     PT Goals (current goals can now be found in the care plan section)    Visit Information  Last PT Received On: 01/22/13 Assistance Needed: +1 History of Present Illness: R direct THA     Subjective Data      Cognition  Cognition Arousal/Alertness: Awake/alert Behavior During Therapy: WFL for tasks assessed/performed Overall Cognitive Status: Within Functional Limits for tasks assessed    Balance     End of Session PT - End of Session Equipment Utilized During Treatment: Gait belt Activity Tolerance: Patient tolerated treatment well Patient left: with call bell/phone within reach;in bed Nurse Communication: Mobility status   GP     Clide Dales 01/22/2013, 10:38 AM Clide Dales, PT Pager: [phone] 01/22/2013

## 2013-01-22 NOTE — Discharge Summary (Signed)
Patient ID: Noah Gonzalez MRN: [id number] DOB/AGE: February 19, 1970 43 y.o.  Admit date: 01/21/2013 Discharge date: 01/22/2013  Admission Diagnoses:  Principal Problem:   Arthritis pain of right hip Active Problems:   Status post THR (total hip replacement)   Discharge Diagnoses:  Same  Past Medical History  Diagnosis Date  . GERD (gastroesophageal reflux disease)   . Arthritis     osteoarthritis-hips  . Low serum testosterone level     recent tx. done    Surgeries: Procedure(s): RIGHT TOTAL HIP ARTHROPLASTY ANTERIOR APPROACH on 01/21/2013   Consultants:    Discharged Condition: Improved  Hospital Course: Noah Gonzalez is an 43 y.o. male who was admitted 01/21/2013 for operative treatment ofArthritis pain of hip. Patient has severe unremitting pain that affects sleep, daily activities, and work/hobbies. After pre-op clearance the patient was taken to the operating room on 01/21/2013 and underwent  Procedure(s): RIGHT TOTAL HIP ARTHROPLASTY ANTERIOR APPROACH.    Patient was given perioperative antibiotics: Anti-infectives   Start     Dose/Rate Route Frequency Ordered Stop   01/21/13 1300  vancomycin (VANCOCIN) IVPB 1000 mg/200 mL premix     1,000 mg 200 mL/hr over 60 Minutes Intravenous Every 12 hours 01/21/13 1210 01/21/13 1650   01/21/13 0615  ceFAZolin (ANCEF) IVPB 2 g/50 mL premix     2 g 100 mL/hr over 30 Minutes Intravenous On call to O.R. 01/21/13 0615 01/21/13 0802       Patient was given sequential compression devices, early ambulation, and chemoprophylaxis to prevent DVT.  Patient benefited maximally from hospital stay and there were no complications.    Recent vital signs: Patient Vitals for the past 24 hrs:  BP Temp Temp src Pulse Resp SpO2 Height Weight  01/22/13 0524 126/71 mmHg 100.4 F (38 C) Oral 85 16 95 % - -  01/22/13 0215 147/70 mmHg 98.3 F (36.8 C) Oral 87 16 95 % - -  01/21/13 2344 - - - - 16 - - -  01/21/13 2206 133/74 mmHg 98.6 F (37 C)  Oral 88 16 98 % - -  01/21/13 1918 - - - - 16 - - -  01/21/13 1835 124/84 mmHg 99.4 F (37.4 C) - 89 16 95 % - -  01/21/13 1600 - - - - 16 97 % - -  01/21/13 1500 108/73 mmHg 98.7 F (37.1 C) Oral 75 14 98 % - -  01/21/13 1412 114/67 mmHg 98.8 F (37.1 C) - 73 14 97 % - -  01/21/13 1300 123/73 mmHg 97.9 F (36.6 C) - 80 16 97 % 5\' 9"  (1.753 m) 111 kg (244 lb 11.4 oz)  01/21/13 1148 114/65 mmHg 98 F (36.7 C) - 70 16 98 % - -  01/21/13 1115 108/60 mmHg 97.8 F (36.6 C) - 64 13 98 % - -  01/21/13 1104 - - - 66 12 98 % - -  01/21/13 1102 - - - 67 14 98 % - -  01/21/13 1100 105/62 mmHg - - 66 14 100 % - -  01/21/13 1057 - - - 69 23 99 % - -  01/21/13 1050 - - - 66 16 98 % - -  01/21/13 1045 118/68 mmHg 97.8 F (36.6 C) - 68 18 98 % - -  01/21/13 1030 122/67 mmHg 97.8 F (36.6 C) - 67 17 99 % - -  01/21/13 1015 111/67 mmHg - - 67 17 99 % - -  01/21/13 1000 106/61 mmHg - - 79  18 100 % - -     Recent laboratory studies:  Recent Labs  01/22/13 0450  WBC 11.6*  HGB 13.5  HCT 39.3  PLT 237  NA 133*  K 4.1  CL 95*  CO2 27  BUN 10  CREATININE 0.95  GLUCOSE 98  CALCIUM 8.7     Discharge Medications:     Medication List    STOP taking these medications       traMADol 50 MG tablet  Commonly known as:  ULTRAM      TAKE these medications       aspirin 325 MG EC tablet  Take 1 tablet (325 mg total) by mouth 2 (two) times daily after a meal.     MELATONIN PO  Take 1 tablet by mouth at bedtime.     methocarbamol 500 MG tablet  Commonly known as:  ROBAXIN  Take 2 tablets (1,000 mg total) by mouth every 6 (six) hours as needed for muscle spasms.     omeprazole 20 MG tablet  Commonly known as:  PRILOSEC OTC  Take 20 mg by mouth daily. takes bedtime     oxyCODONE-acetaminophen 5-325 MG per tablet  Commonly known as:  ROXICET  Take 1-2 tablets by mouth every 4 (four) hours as needed for severe pain.     testosterone cypionate 200 MG/ML injection  Commonly known  as:  DEPOTESTOTERONE CYPIONATE  Inject into the muscle every 14 (fourteen) days.        Diagnostic Studies: Dg Hip Complete Right  01/21/2013   CLINICAL DATA:  Right hip prosthesis  EXAM: RIGHT HIP - COMPLETE 2+ VIEW  COMPARISON:  None.  FINDINGS: New right hip prosthesis is well-seated and aligned. No acute fracture or evidence of an operative complication.  IMPRESSION: Well-positioned right hip prosthesis   Electronically Signed   By: Lajean Manes M.D.   On: 01/21/2013 09:32   Dg Pelvis Portable  01/21/2013   CLINICAL DATA:  Postoperative study from right total hip joint replacement.  EXAM: PORTABLE PELVIS 1-2 VIEWS  COMPARISON:  None.  FINDINGS: The patient has undergone right total hip joint prosthesis placement. Radiographic position of the prosthetic components is good. The bony pelvis exhibits no acute abnormality. Mild narrowing of the superior aspect of the left hip joint space is noted.  IMPRESSION: The patient has undergone right total hip joint prosthesis placement. The radiographic appearance of the prosthetic joint is good where visualized.   Electronically Signed   By: David  Martinique   On: 01/21/2013 10:12   Dg Hip Portable 1 View Right  01/21/2013   CLINICAL DATA:  Right total hip replacement  EXAM: PORTABLE RIGHT HIP - 1 VIEW  COMPARISON:  None.  FINDINGS: Cross-table lateral view of the right hip demonstrates a total right hip arthroplasty without failure or complication. There is no dislocation. There are postsurgical changes in the surrounding soft tissues.  IMPRESSION: Right total hip arthroplasty without failure or complication.   Electronically Signed   By: Kathreen Devoid   On: 01/21/2013 10:12   Dg C-arm 61-120 Min-no Report  01/21/2013   CLINICAL DATA: hip   C-ARM 61-120 MINUTES  Fluoroscopy was utilized by the requesting physician.  No radiographic  interpretation.     Disposition: to home      Discharge Orders   Future Orders Complete By Expires   Call MD / Call 911  As  directed    Comments:     If you experience chest pain  or shortness of breath, CALL 911 and be transported to the hospital emergency room.  If you develope a fever above 101 F, pus (white drainage) or increased drainage or redness at the wound, or calf pain, call your surgeon's office.   Constipation Prevention  As directed    Comments:     Drink plenty of fluids.  Prune juice may be helpful.  You may use a stool softener, such as Colace (over the counter) 100 mg twice a day.  Use MiraLax (over the counter) for constipation as needed.   Diet - low sodium heart healthy  As directed    Discharge instructions  As directed    Comments:     Increase activities as comfort allows. Expect thigh pain and swelling. You can get your actual dressing wet in the shower. You can remove your actual dressing next Friday 1/16 and start getting your actual incision wet. Leave the steri-strips in place.   Discharge patient  As directed    Increase activity slowly as tolerated  As directed       Follow-up Information   Follow up with Mcarthur Rossetti, MD In 2 weeks.   Specialty:  Orthopedic Surgery   Contact information:   [place] [address] [phone]        Signed: Mcarthur Rossetti 01/22/2013, 9:45 AM

## 2013-01-22 NOTE — Progress Notes (Signed)
Subjective: 1 Day Post-Op Procedure(s) (LRB): RIGHT TOTAL HIP ARTHROPLASTY ANTERIOR APPROACH (Right) Patient reports pain as moderate.    Objective: Vital signs in last 24 hours: Temp:  [97.8 F (36.6 C)-100.4 F (38 C)] 100.4 F (38 C) (01/10 0524) Pulse Rate:  [64-89] 85 (01/10 0524) Resp:  [12-23] 16 (01/10 0524) BP: (105-147)/(60-84) 126/71 mmHg (01/10 0524) SpO2:  [95 %-100 %] 95 % (01/10 0524) Weight:  [111 kg (244 lb 11.4 oz)] 111 kg (244 lb 11.4 oz) (01/09 1300)  Intake/Output from previous day: 01/09 0701 - 01/10 0700 In: 4250 [P.O.:600; I.V.:3430; IV Piggyback:220] Out: 3100 [Urine:2800; Blood:300] Intake/Output this shift: Total I/O In: 360 [P.O.:360] Out: -    Recent Labs  01/22/13 0450  HGB 13.5    Recent Labs  01/22/13 0450  WBC 11.6*  RBC 4.50  HCT 39.3  PLT 237    Recent Labs  01/22/13 0450  NA 133*  K 4.1  CL 95*  CO2 27  BUN 10  CREATININE 0.95  GLUCOSE 98  CALCIUM 8.7   No results found for this basename: LABPT, INR,  in the last 72 hours  Sensation intact distally Intact pulses distally Dorsiflexion/Plantar flexion intact Incision: scant drainage  Assessment/Plan: 1 Day Post-Op Procedure(s) (LRB): RIGHT TOTAL HIP ARTHROPLASTY ANTERIOR APPROACH (Right) Discharge home with home health  Noah Gonzalez 01/22/2013, 9:41 AM

## 2013-01-22 NOTE — Care Management Note (Addendum)
    Page 1 of 2   01/22/2013     5:21:07 PM   CARE MANAGEMENT NOTE 01/22/2013  Patient:  Noah Gonzalez, Noah Gonzalez   Account Number:  [id number]  Date Initiated:  01/22/2013  Documentation initiated by:  Dessa Phi  Subjective/Objective Assessment:   43 Y/O M ADMITTED W/R HIP PAIN.     Action/Plan:   FROM HOME.   Anticipated DC Date:  01/22/2013   Anticipated DC Plan:  [place]  CM consult      Choice offered to / List presented to:  C-1 Patient   DME arranged  [facility]      DME agency  [place] arranged  [facility].   Status of service:  Completed, signed off Medicare Important Message given?   (If response is "NO", the following Medicare IM given date fields will be blank) Date Medicare IM given:   Date Additional Medicare IM given:    Discharge Disposition:  [place] [place]  Per UR Regulation:    If discussed at Long Length of Stay Meetings, dates discussed:    Comments:  01/22/13 Eann Cleland RN,BSN NCM 18 [id number] NOTED 3N1 RECOMMENDED,PATIENT HAD ALREADY D/C,NOTIFIED MD TO PLACE ORDER.[facility] REP [place] PATIENT TO CONFIRM THE NEED ,& HAVE DELIVERED IF NEEDED.NURSE AWARE.  HHPT.[facility] CHOSEN FOR HHPT.TC [facility] REP JAMIE AWARE OF HHPT ORDER,& D/C.RW TO BE BROUGHT TO RM PER [facility] DME.NO FURTHER D/C NEEDS OR ORDERS.

## 2013-01-24 ENCOUNTER — Encounter (HOSPITAL_COMMUNITY): Payer: Self-pay | Admitting: Orthopaedic Surgery

## 2013-01-24 NOTE — Progress Notes (Signed)
Utilization review completed.  

## 2013-09-15 ENCOUNTER — Emergency Department (HOSPITAL_COMMUNITY)
Admission: EM | Admit: 2013-09-15 | Discharge: 2013-09-15 | Disposition: A | Discharge status: Home / Self Care | Payer: BC Managed Care – PPO | Attending: Emergency Medicine | Admitting: Emergency Medicine

## 2013-09-15 ENCOUNTER — Emergency Department (HOSPITAL_COMMUNITY): Payer: BC Managed Care – PPO

## 2013-09-15 ENCOUNTER — Encounter (HOSPITAL_COMMUNITY): Payer: Self-pay | Admitting: Emergency Medicine

## 2013-09-15 DIAGNOSIS — S298XXA Other specified injuries of thorax, initial encounter: Secondary | ICD-10-CM | POA: Insufficient documentation

## 2013-09-15 DIAGNOSIS — Z87891 Personal history of nicotine dependence: Secondary | ICD-10-CM | POA: Insufficient documentation

## 2013-09-15 DIAGNOSIS — M169 Osteoarthritis of hip, unspecified: Secondary | ICD-10-CM | POA: Diagnosis not present

## 2013-09-15 DIAGNOSIS — K219 Gastro-esophageal reflux disease without esophagitis: Secondary | ICD-10-CM | POA: Insufficient documentation

## 2013-09-15 DIAGNOSIS — Y9355 Activity, bike riding: Secondary | ICD-10-CM | POA: Insufficient documentation

## 2013-09-15 DIAGNOSIS — Z79899 Other long term (current) drug therapy: Secondary | ICD-10-CM | POA: Insufficient documentation

## 2013-09-15 DIAGNOSIS — Y92410 Unspecified street and highway as the place of occurrence of the external cause: Secondary | ICD-10-CM | POA: Insufficient documentation

## 2013-09-15 DIAGNOSIS — M1610 Unilateral primary osteoarthritis, unspecified hip: Secondary | ICD-10-CM | POA: Insufficient documentation

## 2013-09-15 DIAGNOSIS — S20219A Contusion of unspecified front wall of thorax, initial encounter: Secondary | ICD-10-CM | POA: Diagnosis not present

## 2013-09-15 DIAGNOSIS — Z791 Long term (current) use of non-steroidal anti-inflammatories (NSAID): Secondary | ICD-10-CM | POA: Diagnosis not present

## 2013-09-15 DIAGNOSIS — S20211A Contusion of right front wall of thorax, initial encounter: Secondary | ICD-10-CM

## 2013-09-15 MED ORDER — HYDROCODONE-ACETAMINOPHEN 5-325 MG PO TABS
2.0000 | ORAL_TABLET | Freq: Once | ORAL | Status: AC
  Administered 2013-09-15: 2 via ORAL
  Filled 2013-09-15: qty 2

## 2013-09-15 MED ORDER — NAPROXEN 500 MG PO TABS: 500.0000 mg | ORAL_TABLET | Freq: Two times a day (BID) | ORAL | Status: DC

## 2013-09-15 MED ORDER — HYDROCODONE-ACETAMINOPHEN 5-325 MG PO TABS: 2.0000 | ORAL_TABLET | ORAL | Status: DC | PRN
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
## 2013-09-15 NOTE — Discharge Instructions (Signed)
Please call your doctor for a followup appointment within 24-48 hours. When you talk to your doctor please let them know that you were seen in the emergency department and have them acquire all of your records so that they can discuss the findings with you and formulate a treatment plan to fully care for your new and ongoing problems. ° °

## 2014-11-03 DIAGNOSIS — I10 Essential (primary) hypertension: Secondary | ICD-10-CM | POA: Insufficient documentation

## 2014-11-03 DIAGNOSIS — E669 Obesity, unspecified: Secondary | ICD-10-CM | POA: Insufficient documentation

## 2014-11-03 DIAGNOSIS — E291 Testicular hypofunction: Secondary | ICD-10-CM | POA: Insufficient documentation

## 2014-11-06 DIAGNOSIS — R635 Abnormal weight gain: Secondary | ICD-10-CM | POA: Insufficient documentation

## 2014-11-20 DIAGNOSIS — N529 Male erectile dysfunction, unspecified: Secondary | ICD-10-CM | POA: Insufficient documentation

## 2014-12-31 IMAGING — CR DG HIP 1V PORT*R*
1 series · 1 of 1 positions shown · non-contrast
Comparison: None.

CLINICAL DATA: Right total hip replacement

EXAM:
PORTABLE RIGHT HIP - 1 VIEW

[AP]
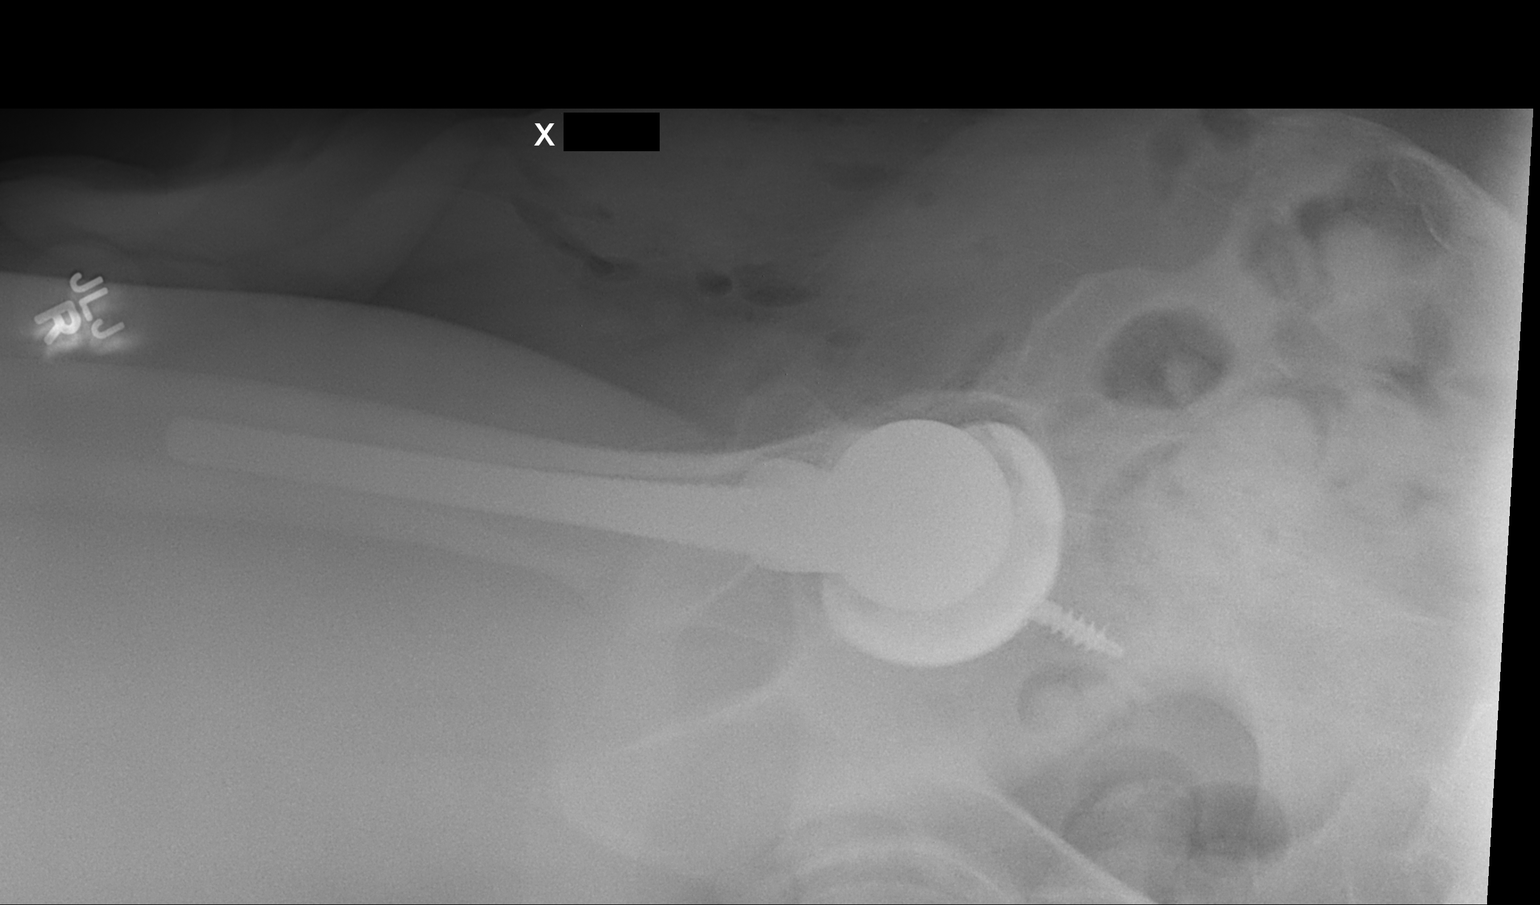

[1 of 1 positions shown; findings below may reference images not displayed]

FINDINGS: Cross-table lateral view of the right hip demonstrates a total right
hip arthroplasty without failure or complication. There is no
dislocation. There are postsurgical changes in the surrounding soft
tissues.
IMPRESSION: Right total hip arthroplasty without failure or complication.

## 2014-12-31 IMAGING — CR DG PORTABLE PELVIS
1 series · 1 of 1 positions shown · non-contrast
Comparison: None.

CLINICAL DATA: Postoperative study from right total hip joint
replacement.

EXAM:
PORTABLE PELVIS 1-2 VIEWS

[AP]
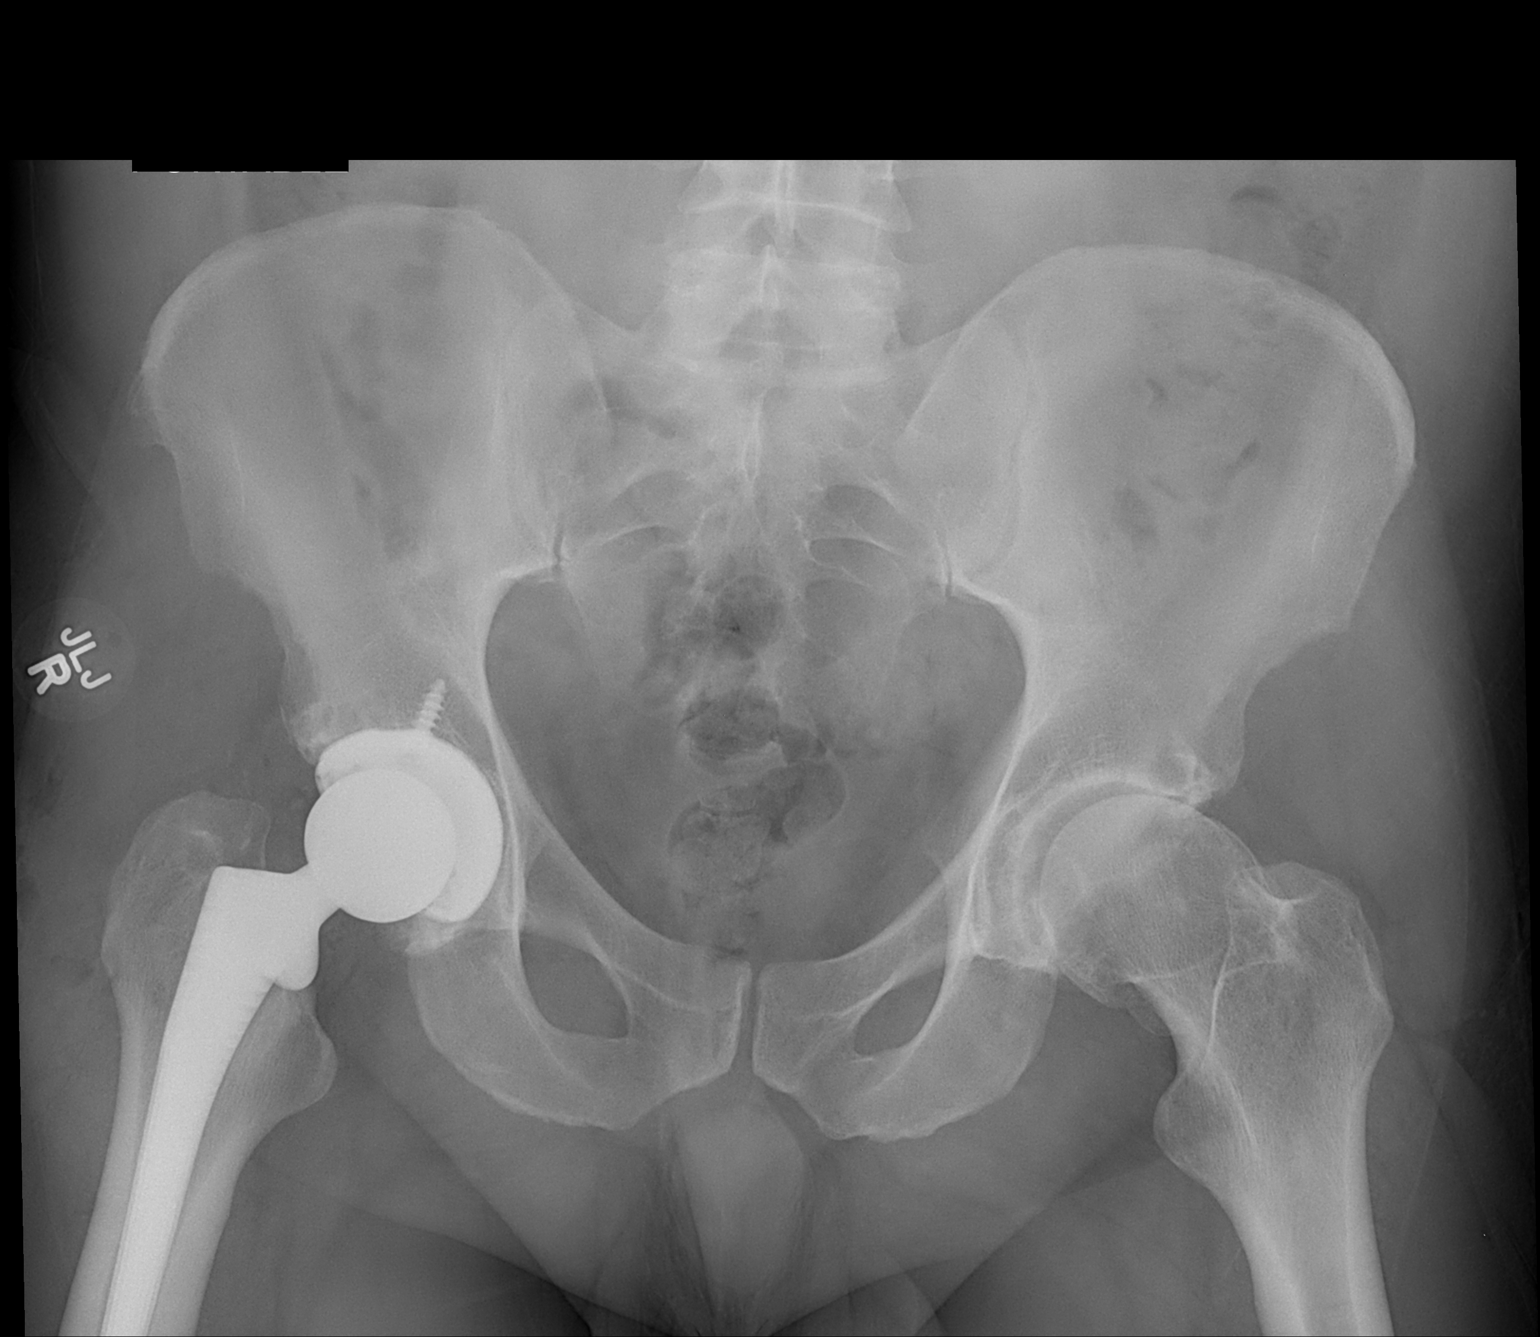

[1 of 1 positions shown; findings below may reference images not displayed]

FINDINGS: The patient has undergone right total hip joint prosthesis
placement. Radiographic position of the prosthetic components is
good. The bony pelvis exhibits no acute abnormality. Mild narrowing
of the superior aspect of the left hip joint space is noted.
IMPRESSION: The patient has undergone right total hip joint prosthesis
placement. The radiographic appearance of the prosthetic joint is
good where visualized.

## 2015-02-02 DIAGNOSIS — N62 Hypertrophy of breast: Secondary | ICD-10-CM | POA: Insufficient documentation

## 2015-08-24 ENCOUNTER — Ambulatory Visit (HOSPITAL_COMMUNITY)
Admission: EM | Admit: 2015-08-24 | Discharge: 2015-08-24 | Disposition: A | Discharge status: Home / Self Care | Payer: 59 | Attending: Family Medicine | Admitting: Family Medicine

## 2015-08-24 ENCOUNTER — Encounter (HOSPITAL_COMMUNITY): Payer: Self-pay | Admitting: *Deleted

## 2015-08-24 DIAGNOSIS — S0181XA Laceration without foreign body of other part of head, initial encounter: Secondary | ICD-10-CM

## 2015-08-24 NOTE — ED Provider Notes (Signed)
[place]    CSN: [id number] Arrival date & time: 08/24/15  1811  First Provider Contact:  First MD Initiated Contact with Patient 08/24/15 1826        History   Chief Complaint Chief Complaint  Patient presents with  . Laceration    HPI Noah Gonzalez is a 45 y.o. male.    Laceration  Location:  Face Facial laceration location:  L eyelid Length:  1.2 Depth:  Cutaneous Quality: straight   Bleeding: controlled   Time since incident:  1 hour Laceration mechanism:  Blunt object Pain details:    Quality:  Sharp   Severity:  Mild Foreign body present:  No foreign bodies Relieved by:  Nothing Worsened by:  Nothing Ineffective treatments:  None tried Tetanus status:  Up to date Associated symptoms: no swelling     Past Medical History:  Diagnosis Date  . Arthritis    osteoarthritis-hips  . GERD (gastroesophageal reflux disease)   . Low serum testosterone level    recent tx. done    Patient Active Problem List   Diagnosis Date Noted  . Arthritis pain of right hip 01/21/2013  . Status post THR (total hip replacement) 01/21/2013    Past Surgical History:  Procedure Laterality Date  . TOTAL HIP ARTHROPLASTY Right 01/21/2013   Procedure: RIGHT TOTAL HIP ARTHROPLASTY ANTERIOR APPROACH;  Surgeon: Mcarthur Rossetti, MD;  Location: [facility];  Service: Orthopedics;  Laterality: Right;  . WISDOM TOOTH EXTRACTION         Home Medications    Prior to Admission medications   Medication Sig Start Date End Date Taking? Authorizing Provider  HYDROcodone-acetaminophen (NORCO/VICODIN) 5-325 MG per tablet Take 2 tablets by mouth every 4 (four) hours as needed. 09/15/13   Noemi Chapel, MD  MELATONIN PO Take 1 tablet by mouth at bedtime.    Historical Provider, MD  naproxen (NAPROSYN) 500 MG tablet Take 1 tablet (500 mg total) by mouth 2 (two) times daily with a meal. 09/15/13   Noemi Chapel, MD  omeprazole (PRILOSEC OTC) 20 MG tablet Take 20 mg by mouth  daily. takes bedtime    Historical Provider, MD  testosterone cypionate (DEPOTESTOTERONE CYPIONATE) 200 MG/ML injection Inject into the muscle every 14 (fourteen) days.    Historical Provider, MD  triamterene-hydrochlorothiazide (MAXZIDE-25) 37.5-25 MG per tablet Take 1 tablet by mouth daily.    Historical Provider, MD    Family History No family history on file.  Social History Social History  Substance Use Topics  . Smoking status: Former Smoker    Years: 2.00    Types: Cigarettes    Quit date: 01/17/2005  . Smokeless tobacco: Former Systems developer    Types: Chew     Comment: quit 6 yrs ago  . Alcohol use Yes     Comment: rare social     Allergies   Review of patient's allergies indicates no known allergies.   Review of Systems Review of Systems  Constitutional: Negative.   HENT: Negative.   Eyes: Negative.   All other systems reviewed and are negative.    Physical Exam Triage Vital Signs ED Triage Vitals  Enc Vitals Group     BP 08/24/15 1826 122/80     Pulse Rate 08/24/15 1826 72     Resp 08/24/15 1826 18     Temp 08/24/15 1826 98.6 F (37 C)     Temp Source 08/24/15 1826 Oral     SpO2 08/24/15 1826 100 %  Weight --      Height --      Head Circumference --      Peak Flow --      Pain Score 08/24/15 1827 3     Pain Loc --      Pain Edu? --      Excl. in [place]? --    No data found.   Updated Vital Signs BP 122/80 (BP Location: Right Arm)   Pulse 72   Temp 98.6 F (37 C) (Oral)   Resp 18   SpO2 100%   Visual Acuity Right Eye Distance:   Left Eye Distance:   Bilateral Distance:    Right Eye Near:   Left Eye Near:    Bilateral Near:     Physical Exam  Constitutional: He is oriented to person, place, and time. He appears well-developed and well-nourished.  HENT:  Head: Normocephalic.  Eyes: Conjunctivae and EOM are normal. Pupils are equal, round, and reactive to light.  Neck: Normal range of motion. Neck supple.  Lymphadenopathy:    He has no  cervical adenopathy.  Neurological: He is alert and oriented to person, place, and time.  Skin: Skin is warm and dry.  Lac as noted.  Nursing note and vitals reviewed.    UC Treatments / Results  Labs (all labs ordered are listed, but only abnormal results are displayed) Labs Reviewed - No data to display  EKG  EKG Interpretation None       Radiology No results found.  Procedures .Marland KitchenLaceration Repair Date/Time: 08/24/2015 6:44 PM Performed by: Billy Fischer Authorized by: Ihor Gully D   Consent:    Consent given by:  Patient   Alternatives discussed:  No treatment Anesthesia (see MAR for exact dosages):    Anesthesia method:  None Laceration details:    Location:  Face   Face location:  L upper eyelid   Extent:  Superficial   Length (cm):  1.2 Repair type:    Repair type:  Simple Pre-procedure details:    Preparation:  Patient was prepped and draped in usual sterile fashion Exploration:    Hemostasis achieved with:  Direct pressure   Wound extent: no fascia violation noted, no foreign bodies/material noted and no muscle damage noted   Treatment:    Amount of cleaning:  Standard   Irrigation solution:  Tap water   Irrigation method:  Tap   Visualized foreign bodies/material removed: no   Skin repair:    Repair method:  Tissue adhesive Approximation:    Approximation:  Close   [place] border: well-aligned   Post-procedure details:    Dressing:  Open (no dressing)   Patient tolerance of procedure:  Tolerated well, no immediate complications   (including critical care time)  Medications Ordered in UC Medications - No data to display   Initial Impression / Assessment and Plan / UC Course  I have reviewed the triage vital signs and the nursing notes.  Pertinent labs & imaging results that were available during my care of the patient were reviewed by me and considered in my medical decision making (see chart for details).  Clinical Course       Final Clinical Impressions(s) / UC Diagnoses   Final diagnoses:  None    New Prescriptions New Prescriptions   No medications on file     Billy Fischer, MD 08/24/15 1850

## 2015-08-25 IMAGING — CR DG RIBS W/ CHEST 3+V*R*
5 series · 5 of 5 positions shown · non-contrast
Comparison: None.

CLINICAL DATA: Fall, right anterior rib pain.

EXAM:
RIGHT RIBS AND CHEST - 3+ VIEW

[w chest pa]
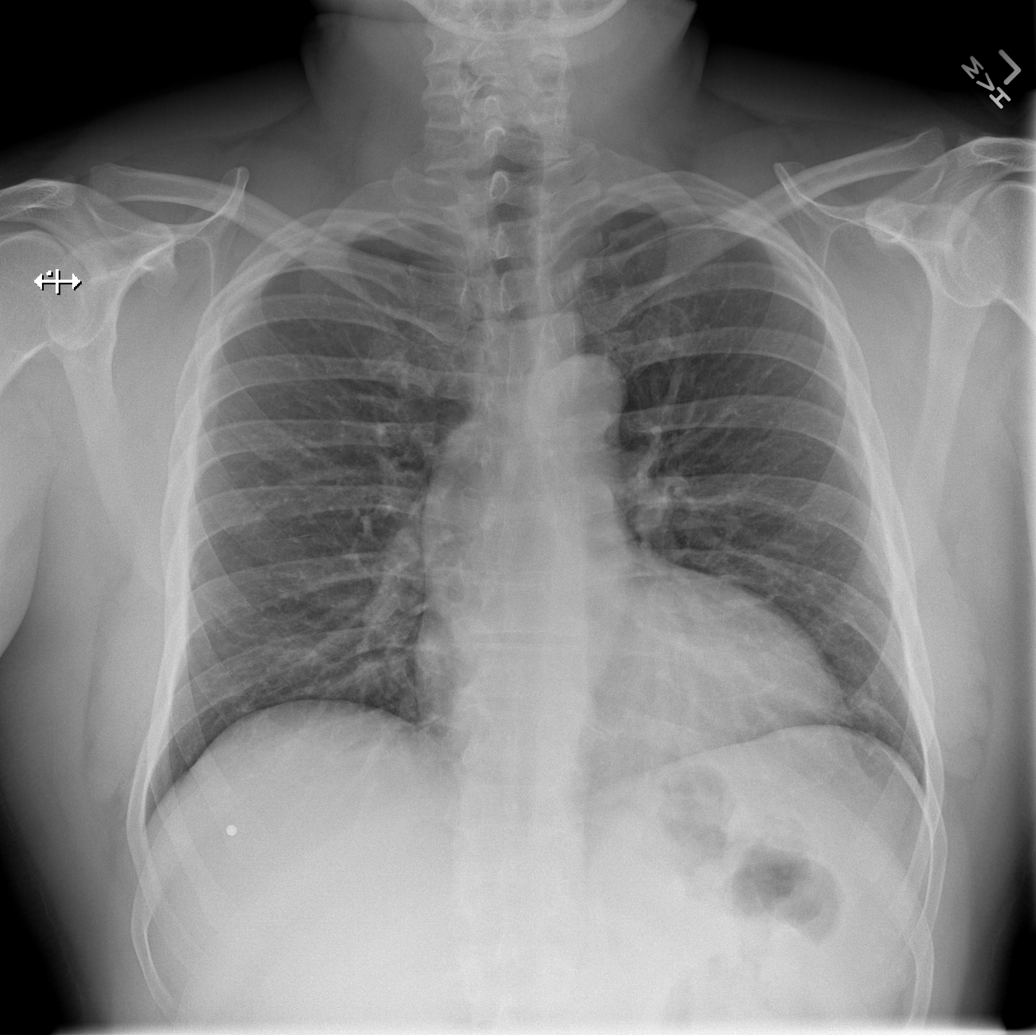

[w ribs ap upper right]
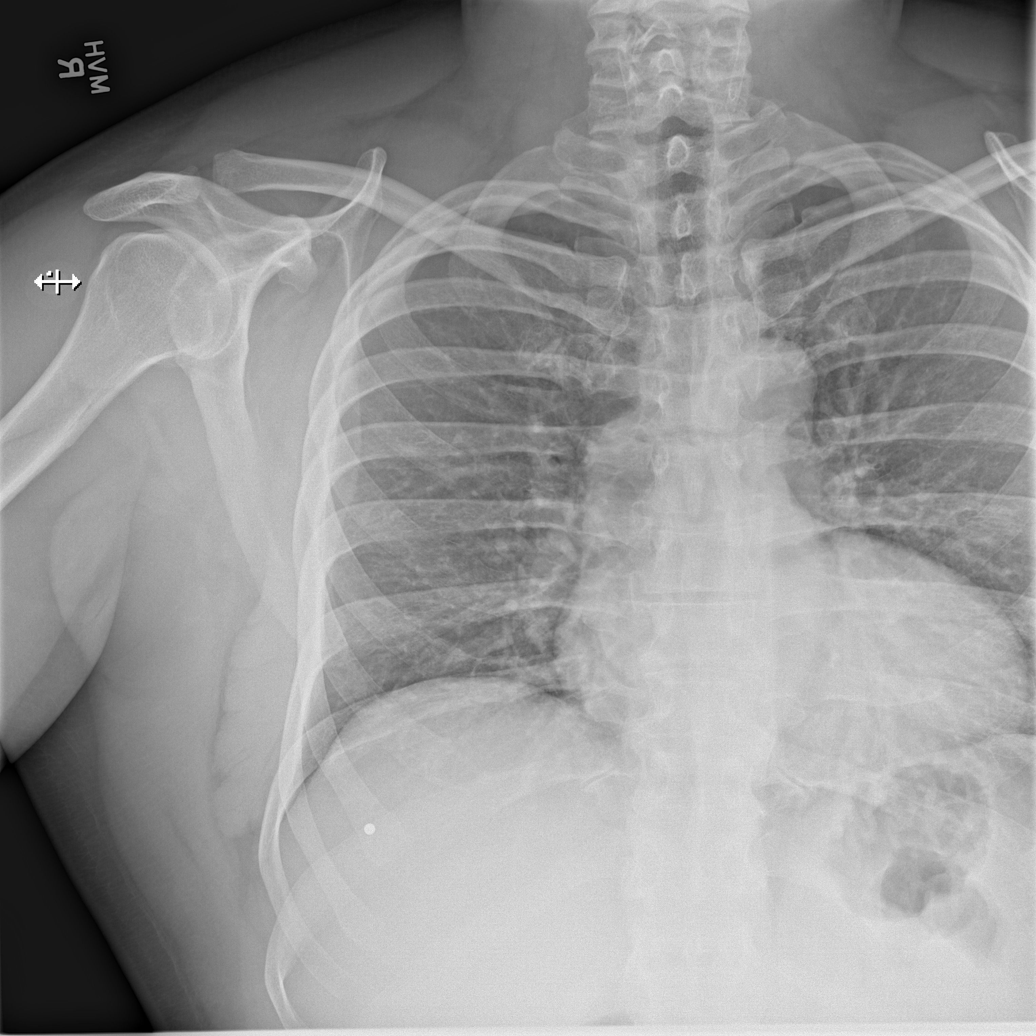

[w ribs ap lower right]
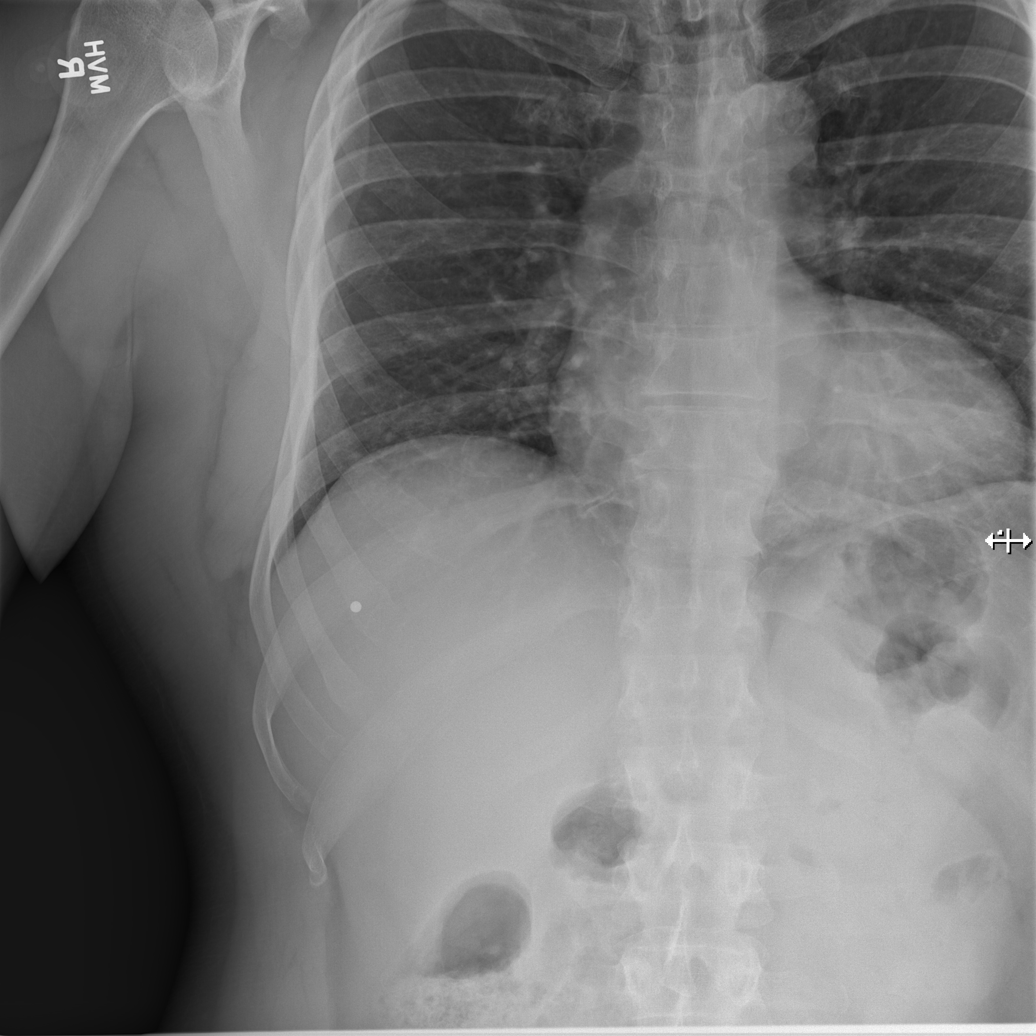

[w ribs obl right (1 of 2)]
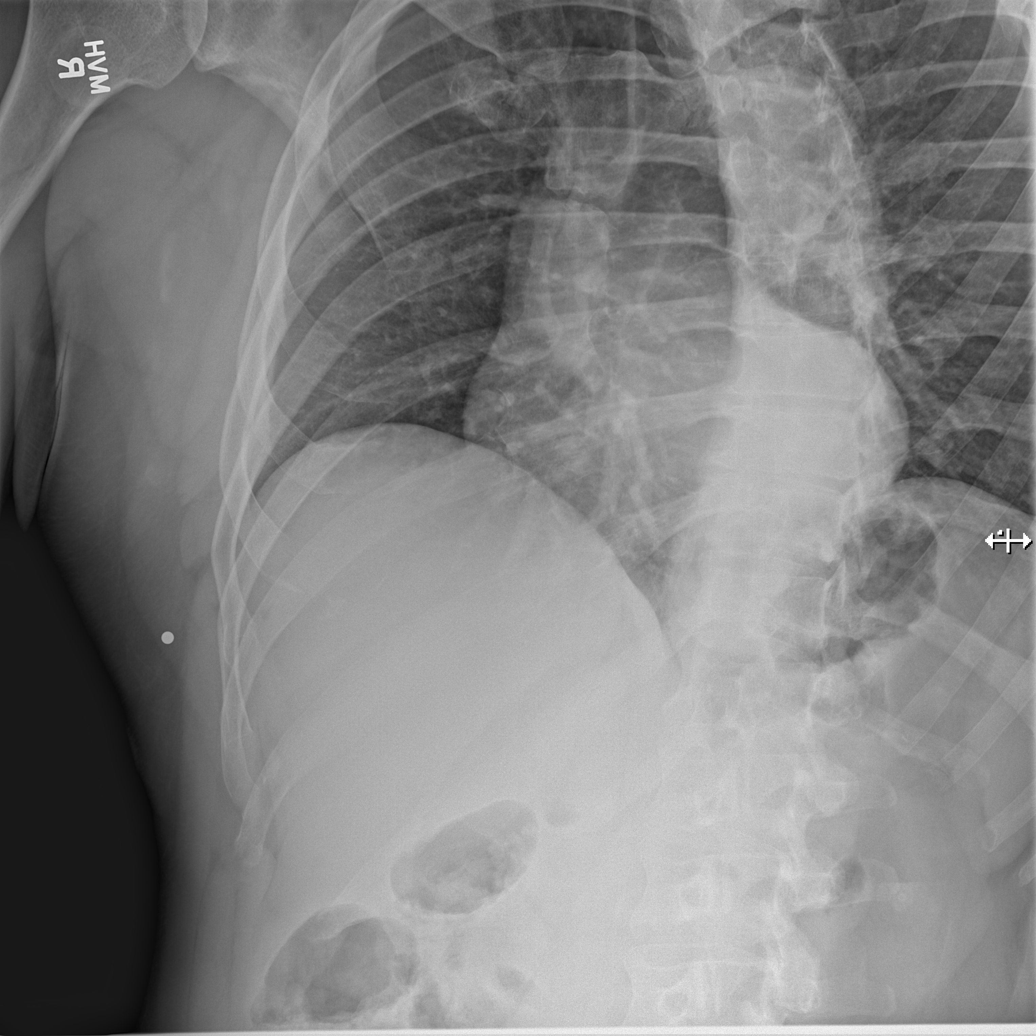

[w ribs obl right (2 of 2)]
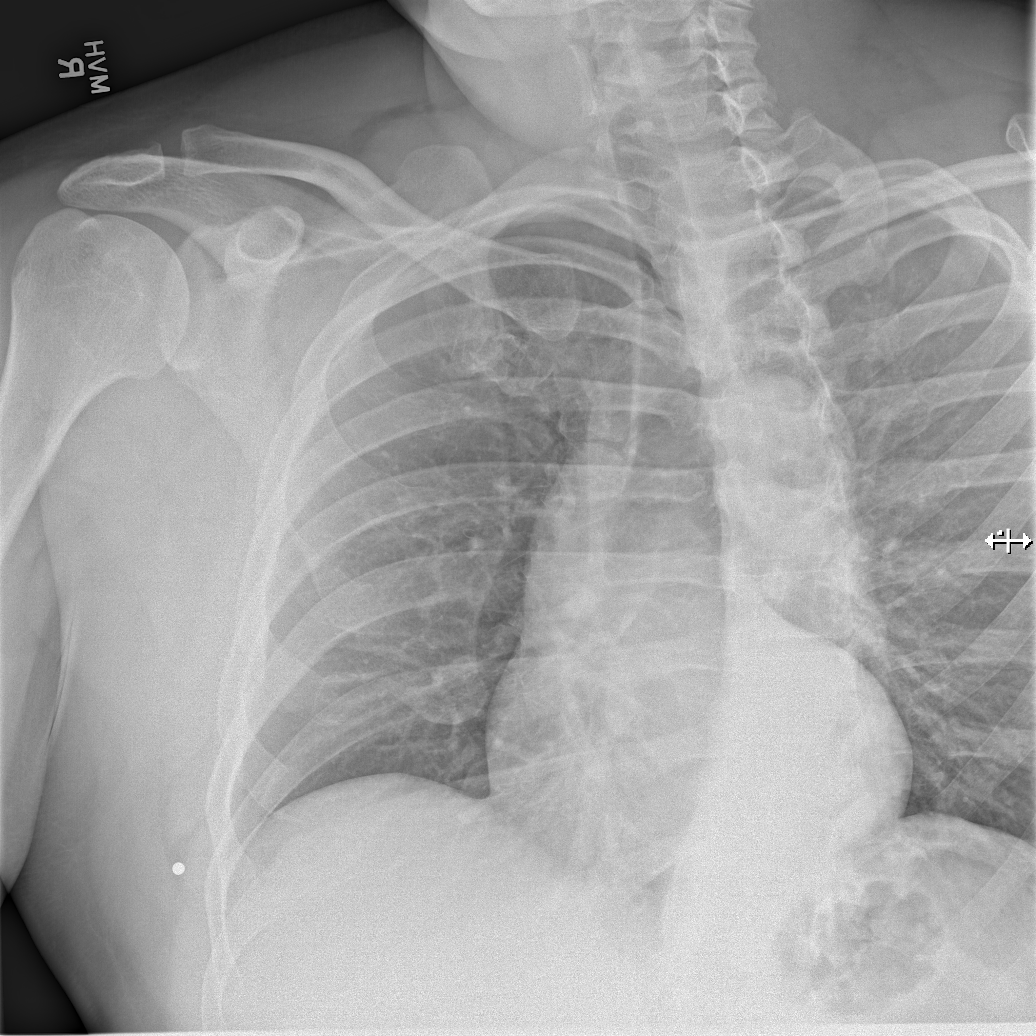

[5 of 5 positions shown; findings below may reference images not displayed]

FINDINGS: No fracture or other bone lesions are seen involving the ribs. There
is no evidence of pneumothorax or pleural effusion. Both lungs are
clear. Heart size and mediastinal contours are within normal limits.
IMPRESSION: Negative.

  By: Orantes Edmundo

## 2015-10-18 DIAGNOSIS — R972 Elevated prostate specific antigen [PSA]: Secondary | ICD-10-CM | POA: Insufficient documentation

## 2015-11-21 ENCOUNTER — Ambulatory Visit (INDEPENDENT_AMBULATORY_CARE_PROVIDER_SITE_OTHER): Payer: 59 | Admitting: Physician Assistant

## 2015-11-21 ENCOUNTER — Ambulatory Visit (INDEPENDENT_AMBULATORY_CARE_PROVIDER_SITE_OTHER): Payer: 59

## 2015-11-21 DIAGNOSIS — M25552 Pain in left hip: Secondary | ICD-10-CM

## 2015-11-21 NOTE — Progress Notes (Signed)
Office Visit Note   Patient: Noah Gonzalez           Date of Birth: 02/07/1970           MRN: [id number] Visit Date: 11/21/2015              Requested by: No referring provider defined for this encounter. PCP: No PCP Per Patient   Assessment & Plan: Visit Diagnoses:  1. Pain in left hip     Plan: Left total hip arthroplasty in the near future. Questions encouraged and answered length by Dr. Ninfa Linden and myself. She understands risks benefits of left total hip arthroplasty and possible complications including prolonged pain, worsening pain, PE /DVT, wound healing problems, nerve or vessel injury. See him back in 2 weeks postop.  Follow-Up Instructions: Return 2 weeks after surgery.   Orders:  Orders Placed This Encounter  Procedures  . XR HIP UNILAT W OR W/O PELVIS 1V LEFT   No orders of the defined types were placed in this encounter.     Procedures: No procedures performed   Clinical Data: No additional findings.   Subjective: Chief Complaint  Patient presents with  . Left Hip - Pain    Patient had injection 04/02/15, states this worked for quite sometime. Patient states he surfs a lot, and did so in September. He states he has had a lot of pain since then.    Mr. Noah Gonzalez 45 year old male well known to Dr. Ninfa Linden services. History of right total hip arthroplasty January 2016. Right hip overall is well with occasional pain after intense physical activity. He is now having left hip pain that is constant and associated with muscle spasm. Status post left hip intra articular injection 04/02/2015, the injection did well until mid summer. He states he had been doing some paddle boarding 5-6 hours a day and the pain return after these activities. He has tried dry needling and physical therapy manipulation of the hip, but continues to have  severe left hip pain. He has difficulty putting on shoes and socks and actually has to have his wife put on his left shoe and sock at  times.     Review of Systems  See HPI   Objective: Vital Signs: There were no vitals taken for this visit.  Physical Exam  Constitutional: He is oriented to person, place, and time. He appears well-developed and well-nourished. No distress.  HENT:  Head: Normocephalic and atraumatic.  Eyes: EOM are normal.  Pulmonary/Chest: Effort normal.  Neurological: He is alert and oriented to person, place, and time.  Psychiatric: He has a normal mood and affect.    Ortho Exam Good range of motion of the right hip without pain. Left hip virtually no internal or external rotation and attempts of motion cause discomfort. He walks without any assistive device with an antalgic gait on the left. Leg lengths appeared near equal with the left length approximately 1 mm longer than the right. Dorsalis pedal pulses are intact bilateral. Specialty Comments:  No specialty comments available.  Imaging: Xr Hip Unilat W Or W/o Pelvis 1v Left  Result Date: 11/21/2015 AP pelvis and lateral view of the left hip: Total hip arthroplasty components well-seated without any signs, leg lengths appear equal on radiograph. 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 No acute fractures.    PMFS History: Patient Active Problem List   Diagnosis Date Noted  . Arthritis pain of right hip 01/21/2013  . Status post THR (total hip  replacement) 01/21/2013   Past Medical History:  Diagnosis Date  . Arthritis    osteoarthritis-hips  . GERD (gastroesophageal reflux disease)   . Low serum testosterone level    recent tx. done    No family history on file.  Past Surgical History:  Procedure Laterality Date  . TOTAL HIP ARTHROPLASTY Right 01/21/2013   Procedure: RIGHT TOTAL HIP ARTHROPLASTY ANTERIOR APPROACH;  Surgeon: Mcarthur Rossetti, MD;  Location: [facility];  Service: Orthopedics;  Laterality: Right;  . WISDOM TOOTH EXTRACTION     Social History   Occupational History  . Not on file.    Social History Main Topics  . Smoking status: Former Smoker    Years: 2.00    Types: Cigarettes    Quit date: 01/17/2005  . Smokeless tobacco: Former Systems developer    Types: Chew     Comment: quit 6 yrs ago  . Alcohol use Yes     Comment: rare social  . Drug use: No  . Sexual activity: Yes

## 2015-11-27 ENCOUNTER — Telehealth (INDEPENDENT_AMBULATORY_CARE_PROVIDER_SITE_OTHER): Payer: Self-pay | Admitting: Radiology

## 2015-12-14 ENCOUNTER — Telehealth (INDEPENDENT_AMBULATORY_CARE_PROVIDER_SITE_OTHER): Payer: Self-pay | Admitting: Orthopaedic Surgery

## 2015-12-14 NOTE — Telephone Encounter (Signed)
Noah Gonzalez called saying he's scheduled to have a Left Hip Replacement on December 22nd. He remembers having to go to the hospital weeks before his Right Hip was replaced and his vitals and labs were taken. He's wondering if an appt at the hospital is needed before his Left Hip surgery. He'd like a phone call regarding this.  Pt's ph# [phone] Thank you.

## 2015-12-17 NOTE — Telephone Encounter (Signed)
I'm pretty sure he's talking about pre-op

## 2015-12-18 ENCOUNTER — Other Ambulatory Visit (INDEPENDENT_AMBULATORY_CARE_PROVIDER_SITE_OTHER): Payer: Self-pay | Admitting: Physician Assistant

## 2015-12-18 NOTE — Telephone Encounter (Signed)
I called patient and advised that hospital should contact him very soon to schedule pre-op.  His surgery is on the schedule in [facility].

## 2015-12-19 NOTE — Telephone Encounter (Signed)
Already addressed

## 2015-12-21 NOTE — Telephone Encounter (Signed)
Patient says he is scheduled for hip replacement on 01/04/16. He is requesting 800mg  ibprofen for pain as well as tramadol for night time to help him sleep.  Cb#: [phone]

## 2015-12-24 MED ORDER — IBUPROFEN 800 MG PO TABS: 800.0000 mg | ORAL_TABLET | Freq: Three times a day (TID) | ORAL | 3 refills | Status: DC | PRN

## 2015-12-24 MED ORDER — TRAMADOL HCL 50 MG PO TABS: 100.0000 mg | ORAL_TABLET | Freq: Two times a day (BID) | ORAL | 0 refills | Status: DC | PRN

## 2015-12-24 NOTE — Telephone Encounter (Signed)
I sent in the ibuprofen.  Please phone in the tramadol that I did put in the system.  Thanks!

## 2015-12-24 NOTE — Telephone Encounter (Signed)
Called into pharmacy

## 2015-12-24 NOTE — Telephone Encounter (Signed)
Please advise 

## 2015-12-26 NOTE — Patient Instructions (Signed)
Noah Gonzalez  12/26/2015   Your procedure is scheduled on: 01/04/16  Report to [facility] Main  Entrance take [place]  elevators to 3rd floor to  [place] at [place]  AM.  Call this number if you have problems the morning of surgery [phone]   Remember: ONLY 1 PERSON MAY GO WITH YOU TO SHORT STAY TO GET  READY MORNING OF [place].  Do not eat food or drink liquids :After Midnight.     Take these medicines the morning of surgery with A SIP OF WATER: NO REGULAR MEDICATIONS MAY TAKE TRAMADOL IF NEEDED DO NOT TAKE ANY DIABETIC MEDICATIONS DAY OF YOUR SURGERY                               You may not have any metal on your body including hair pins and              piercings  Do not wear jewelry, make-up, lotions, powders or perfumes, deodorant             Do not wear nail polish.  Do not shave  48 hours prior to surgery.              Men may shave face and neck.   Do not bring valuables to the hospital. [place].  Contacts, dentures or bridgework may not be worn into surgery.  Leave suitcase in the car. After surgery it may be brought to your room.                Please read over the following fact sheets you were given: _____________________________________________________________________             [facility] - Preparing for Surgery Before surgery, you can play an important role.  Because skin is not sterile, your skin needs to be as free of germs as possible.  You can reduce the number of germs on your skin by washing with CHG (chlorahexidine gluconate) soap before surgery.  CHG is an antiseptic cleaner which kills germs and bonds with the skin to continue killing germs even after washing. Please DO NOT use if you have an allergy to CHG or antibacterial soaps.  If your skin becomes reddened/irritated stop using the CHG and inform your nurse when you arrive at Short Stay. Do not shave  (including legs and underarms) for at least 48 hours prior to the first CHG shower.  You may shave your face/neck. Please follow these instructions carefully:  1.  Shower with CHG Soap the night before surgery and the  morning of Surgery.  2.  If you choose to wash your hair, wash your hair first as usual with your  normal  shampoo.  3.  After you shampoo, rinse your hair and body thoroughly to remove the  shampoo.                           4.  Use CHG as you would any other liquid soap.  You can apply chg directly  to the skin and wash  Gently with a scrungie or clean washcloth.  5.  Apply the CHG Soap to your body ONLY FROM THE NECK DOWN.   Do not use on face/ open                           Wound or open sores. Avoid contact with eyes, ears mouth and genitals (private parts).                       Wash face,  Genitals (private parts) with your normal soap.             6.  Wash thoroughly, paying special attention to the area where your surgery  will be performed.  7.  Thoroughly rinse your body with warm water from the neck down.  8.  DO NOT shower/wash with your normal soap after using and rinsing off  the CHG Soap.                9.  Pat yourself dry with a clean towel.            10.  Wear clean pajamas.            11.  Place clean sheets on your bed the night of your first shower and do not  sleep with pets. Day of Surgery : Do not apply any lotions/deodorants the morning of surgery.  Please wear clean clothes to the hospital/surgery center.  FAILURE TO FOLLOW THESE INSTRUCTIONS MAY RESULT IN THE CANCELLATION OF YOUR SURGERY PATIENT SIGNATURE_________________________________  NURSE SIGNATURE__________________________________  ________________________________________________________________________   Noah Gonzalez  An incentive spirometer is a tool that can help keep your lungs clear and active. This tool measures how well you are filling your lungs with  each breath. Taking long deep breaths may help reverse or decrease the chance of developing breathing (pulmonary) problems (especially infection) following:  A long period of time when you are unable to move or be active. BEFORE THE PROCEDURE   If the spirometer includes an indicator to show your best effort, your nurse or respiratory therapist will set it to a desired goal.  If possible, sit up straight or lean slightly forward. Try not to slouch.  Hold the incentive spirometer in an upright position. INSTRUCTIONS FOR USE  1. Sit on the edge of your bed if possible, or sit up as far as you can in bed or on a chair. 2. Hold the incentive spirometer in an upright position. 3. Breathe out normally. 4. Place the mouthpiece in your mouth and seal your lips tightly around it. 5. Breathe in slowly and as deeply as possible, raising the piston or the ball toward the top of the column. 6. Hold your breath for 3-5 seconds or for as long as possible. Allow the piston or ball to fall to the bottom of the column. 7. Remove the mouthpiece from your mouth and breathe out normally. 8. Rest for a few seconds and repeat Steps 1 through 7 at least 10 times every 1-2 hours when you are awake. Take your time and take a few normal breaths between deep breaths. 9. The spirometer may include an indicator to show your best effort. Use the indicator as a goal to work toward during each repetition. 10. After each set of 10 deep breaths, practice coughing to be sure your lungs are clear. If you have an incision (the cut made at the time of surgery),  support your incision when coughing by placing a pillow or rolled up towels firmly against it. Once you are able to get out of bed, walk around indoors and cough well. You may stop using the incentive spirometer when instructed by your caregiver.  RISKS AND COMPLICATIONS  Take your time so you do not get dizzy or light-headed.  If you are in pain, you may need to take or  ask for pain medication before doing incentive spirometry. It is harder to take a deep breath if you are having pain. AFTER USE  Rest and breathe slowly and easily.  It can be helpful to keep track of a log of your progress. Your caregiver can provide you with a simple table to help with this. If you are using the spirometer at home, follow these instructions: [place] IF:   You are having difficultly using the spirometer.  You have trouble using the spirometer as often as instructed.  Your pain medication is not giving enough relief while using the spirometer.  You develop fever of 100.5 F (38.1 C) or higher. SEEK IMMEDIATE MEDICAL CARE IF:   You cough up bloody sputum that had not been present before.  You develop fever of 102 F (38.9 C) or greater.  You develop worsening pain at or near the incision site. MAKE SURE YOU:   Understand these instructions.  Will watch your condition.  Will get help right away if you are not doing well or get worse. Document Released: 05/12/2006 Document Revised: 03/24/2011 Document Reviewed: 07/13/2006 [facility] Patient Information 2014 [place], [place].   ________________________________________________________________________

## 2015-12-27 ENCOUNTER — Encounter (INDEPENDENT_AMBULATORY_CARE_PROVIDER_SITE_OTHER): Payer: Self-pay

## 2015-12-27 ENCOUNTER — Encounter (HOSPITAL_COMMUNITY)
Admission: RE | Admit: 2015-12-27 | Discharge: 2015-12-27 | Disposition: A | Discharge status: Home / Self Care | Payer: 59 | Source: Ambulatory Visit | Attending: Orthopaedic Surgery | Admitting: Orthopaedic Surgery

## 2015-12-27 ENCOUNTER — Encounter (HOSPITAL_COMMUNITY): Payer: Self-pay

## 2015-12-27 DIAGNOSIS — M1612 Unilateral primary osteoarthritis, left hip: Secondary | ICD-10-CM | POA: Diagnosis not present

## 2015-12-27 DIAGNOSIS — I10 Essential (primary) hypertension: Secondary | ICD-10-CM | POA: Diagnosis not present

## 2015-12-27 DIAGNOSIS — Z01812 Encounter for preprocedural laboratory examination: Secondary | ICD-10-CM | POA: Insufficient documentation

## 2015-12-27 HISTORY — DX: Essential (primary) hypertension: I10

## 2015-12-27 LAB — CBC
HCT: 45.8 % (ref 39.0–52.0)
HEMOGLOBIN: 16.2 g/dL (ref 13.0–17.0)
MCH: 31.4 pg (ref 26.0–34.0)
MCHC: 35.4 g/dL (ref 30.0–36.0)
MCV: 88.8 fL (ref 78.0–100.0)
Platelets: 246 10*3/uL (ref 150–400)
RBC: 5.16 MIL/uL (ref 4.22–5.81)
RDW: 12.2 % (ref 11.5–15.5)
WBC: 5.7 10*3/uL (ref 4.0–10.5)

## 2015-12-27 LAB — BASIC METABOLIC PANEL
ANION GAP: 8 (ref 5–15)
BUN: 19 mg/dL (ref 6–20)
CALCIUM: 9.4 mg/dL (ref 8.9–10.3)
CO2: 26 mmol/L (ref 22–32)
Chloride: 101 mmol/L (ref 101–111)
Creatinine, Ser: 1.03 mg/dL (ref 0.61–1.24)
Glucose, Bld: 70 mg/dL (ref 65–99)
Potassium: 4.4 mmol/L (ref 3.5–5.1)
Sodium: 135 mmol/L (ref 135–145)

## 2015-12-27 LAB — SURGICAL PCR SCREEN
MRSA, PCR: NEGATIVE
STAPHYLOCOCCUS AUREUS: NEGATIVE

## 2016-01-04 ENCOUNTER — Inpatient Hospital Stay (HOSPITAL_COMMUNITY)
Admission: RE | Admit: 2016-01-04 | Discharge: 2016-01-06 | DRG: 470 | Disposition: A | Discharge status: Home Health | Payer: 59 | Source: Ambulatory Visit | Attending: Orthopaedic Surgery | Admitting: Orthopaedic Surgery

## 2016-01-04 ENCOUNTER — Encounter (HOSPITAL_COMMUNITY)
Admission: RE | Disposition: A | Discharge status: Home Health | Payer: Self-pay | Source: Ambulatory Visit | Attending: Orthopaedic Surgery

## 2016-01-04 ENCOUNTER — Encounter (HOSPITAL_COMMUNITY): Payer: Self-pay | Admitting: *Deleted

## 2016-01-04 ENCOUNTER — Inpatient Hospital Stay (HOSPITAL_COMMUNITY): Payer: 59

## 2016-01-04 ENCOUNTER — Inpatient Hospital Stay (HOSPITAL_COMMUNITY): Payer: 59 | Admitting: Anesthesiology

## 2016-01-04 DIAGNOSIS — Z96641 Presence of right artificial hip joint: Secondary | ICD-10-CM | POA: Diagnosis present

## 2016-01-04 DIAGNOSIS — Z419 Encounter for procedure for purposes other than remedying health state, unspecified: Secondary | ICD-10-CM

## 2016-01-04 DIAGNOSIS — M1612 Unilateral primary osteoarthritis, left hip: Principal | ICD-10-CM | POA: Diagnosis present

## 2016-01-04 DIAGNOSIS — Z96643 Presence of artificial hip joint, bilateral: Secondary | ICD-10-CM

## 2016-01-04 DIAGNOSIS — Z87891 Personal history of nicotine dependence: Secondary | ICD-10-CM

## 2016-01-04 DIAGNOSIS — M25552 Pain in left hip: Secondary | ICD-10-CM | POA: Diagnosis present

## 2016-01-04 DIAGNOSIS — Z96642 Presence of left artificial hip joint: Secondary | ICD-10-CM

## 2016-01-04 DIAGNOSIS — I10 Essential (primary) hypertension: Secondary | ICD-10-CM | POA: Diagnosis present

## 2016-01-04 DIAGNOSIS — K219 Gastro-esophageal reflux disease without esophagitis: Secondary | ICD-10-CM | POA: Diagnosis present

## 2016-01-04 HISTORY — PX: TOTAL HIP ARTHROPLASTY: SHX124

## 2016-01-04 SURGERY — ARTHROPLASTY, HIP, TOTAL, ANTERIOR APPROACH
Anesthesia: Spinal | Site: Hip | Laterality: Left

## 2016-01-04 MED ORDER — KETOROLAC TROMETHAMINE 15 MG/ML IJ SOLN
15.0000 mg | Freq: Four times a day (QID) | INTRAMUSCULAR | Status: AC
  Administered 2016-01-04 – 2016-01-05 (×4): 15 mg via INTRAVENOUS
  Filled 2016-01-04 (×4): qty 1

## 2016-01-04 MED ORDER — OXYCODONE HCL 5 MG PO TABS
5.0000 mg | ORAL_TABLET | ORAL | Status: DC | PRN
  Administered 2016-01-04 – 2016-01-06 (×13): 10 mg via ORAL
  Filled 2016-01-04 (×13): qty 2

## 2016-01-04 MED ORDER — ONDANSETRON HCL 4 MG/2ML IJ SOLN
INTRAMUSCULAR | Status: AC
  Filled 2016-01-04: qty 2

## 2016-01-04 MED ORDER — SODIUM CHLORIDE 0.9 % IR SOLN
Status: DC | PRN
  Administered 2016-01-04: 1000 mL

## 2016-01-04 MED ORDER — ONDANSETRON HCL 4 MG PO TABS: 4.0000 mg | ORAL_TABLET | Freq: Four times a day (QID) | ORAL | Status: DC | PRN

## 2016-01-04 MED ORDER — MIDAZOLAM HCL 5 MG/5ML IJ SOLN
INTRAMUSCULAR | Status: DC | PRN
Start: 2016-01-04 — End: 2016-01-04
  Administered 2016-01-04: 2 mg via INTRAVENOUS

## 2016-01-04 MED ORDER — MENTHOL 3 MG MT LOZG: 1.0000 | LOZENGE | OROMUCOSAL | Status: DC | PRN

## 2016-01-04 MED ORDER — TAMSULOSIN HCL 0.4 MG PO CAPS
0.4000 mg | ORAL_CAPSULE | Freq: Every day | ORAL | Status: AC
  Administered 2016-01-05: 0.4 mg via ORAL
  Filled 2016-01-04: qty 1

## 2016-01-04 MED ORDER — DEXAMETHASONE SODIUM PHOSPHATE 10 MG/ML IJ SOLN
INTRAMUSCULAR | Status: AC
  Filled 2016-01-04: qty 1

## 2016-01-04 MED ORDER — ALUM & MAG HYDROXIDE-SIMETH 200-200-20 MG/5ML PO SUSP: 30.0000 mL | ORAL | Status: DC | PRN

## 2016-01-04 MED ORDER — ACETAMINOPHEN 325 MG PO TABS
650.0000 mg | ORAL_TABLET | Freq: Four times a day (QID) | ORAL | Status: DC | PRN
  Administered 2016-01-06: 650 mg via ORAL
  Filled 2016-01-04: qty 2

## 2016-01-04 MED ORDER — MEPERIDINE HCL 50 MG/ML IJ SOLN: 6.2500 mg | INTRAMUSCULAR | Status: DC | PRN

## 2016-01-04 MED ORDER — CYCLOBENZAPRINE HCL 10 MG PO TABS
10.0000 mg | ORAL_TABLET | Freq: Three times a day (TID) | ORAL | Status: DC | PRN
  Administered 2016-01-04 – 2016-01-06 (×5): 10 mg via ORAL
  Filled 2016-01-04 (×5): qty 1

## 2016-01-04 MED ORDER — PROPOFOL 10 MG/ML IV BOLUS
INTRAVENOUS | Status: AC
  Filled 2016-01-04: qty 60

## 2016-01-04 MED ORDER — ZOLPIDEM TARTRATE 5 MG PO TABS
5.0000 mg | ORAL_TABLET | Freq: Every evening | ORAL | Status: DC | PRN
  Administered 2016-01-04: 5 mg via ORAL
  Filled 2016-01-04: qty 1

## 2016-01-04 MED ORDER — ACETAMINOPHEN 650 MG RE SUPP: 650.0000 mg | Freq: Four times a day (QID) | RECTAL | Status: DC | PRN

## 2016-01-04 MED ORDER — CEFAZOLIN SODIUM-DEXTROSE 2-4 GM/100ML-% IV SOLN
2.0000 g | INTRAVENOUS | Status: AC
  Administered 2016-01-04: 2 g via INTRAVENOUS
  Filled 2016-01-04: qty 100

## 2016-01-04 MED ORDER — HYDROMORPHONE HCL 1 MG/ML IJ SOLN
1.0000 mg | INTRAMUSCULAR | Status: DC | PRN
  Administered 2016-01-04 (×2): 1 mg via INTRAVENOUS
  Filled 2016-01-04 (×2): qty 1

## 2016-01-04 MED ORDER — CEFAZOLIN SODIUM-DEXTROSE 2-4 GM/100ML-% IV SOLN
INTRAVENOUS | Status: AC
  Filled 2016-01-04: qty 100

## 2016-01-04 MED ORDER — DIAZEPAM 5 MG PO TABS
5.0000 mg | ORAL_TABLET | Freq: Two times a day (BID) | ORAL | Status: DC | PRN
  Administered 2016-01-04 – 2016-01-06 (×3): 5 mg via ORAL
  Filled 2016-01-04 (×3): qty 1

## 2016-01-04 MED ORDER — METOCLOPRAMIDE HCL 5 MG/ML IJ SOLN: 5.0000 mg | Freq: Three times a day (TID) | INTRAMUSCULAR | Status: DC | PRN

## 2016-01-04 MED ORDER — TRANEXAMIC ACID 1000 MG/10ML IV SOLN
1000.0000 mg | INTRAVENOUS | Status: AC
  Administered 2016-01-04: 1000 mg via INTRAVENOUS
  Filled 2016-01-04: qty 1100

## 2016-01-04 MED ORDER — ASPIRIN 81 MG PO CHEW
81.0000 mg | CHEWABLE_TABLET | Freq: Two times a day (BID) | ORAL | Status: DC
  Administered 2016-01-04 – 2016-01-06 (×4): 81 mg via ORAL
  Filled 2016-01-04 (×4): qty 1

## 2016-01-04 MED ORDER — ONDANSETRON HCL 4 MG/2ML IJ SOLN
INTRAMUSCULAR | Status: DC | PRN
  Administered 2016-01-04: 4 mg via INTRAVENOUS

## 2016-01-04 MED ORDER — MIDAZOLAM HCL 2 MG/2ML IJ SOLN
INTRAMUSCULAR | Status: AC
  Filled 2016-01-04: qty 2

## 2016-01-04 MED ORDER — DOCUSATE SODIUM 100 MG PO CAPS
100.0000 mg | ORAL_CAPSULE | Freq: Two times a day (BID) | ORAL | Status: DC
  Administered 2016-01-04 – 2016-01-06 (×4): 100 mg via ORAL
  Filled 2016-01-04 (×4): qty 1

## 2016-01-04 MED ORDER — DIPHENHYDRAMINE HCL 12.5 MG/5ML PO ELIX: 12.5000 mg | ORAL_SOLUTION | ORAL | Status: DC | PRN

## 2016-01-04 MED ORDER — LACTATED RINGERS IV SOLN
INTRAVENOUS | Status: DC
  Administered 2016-01-04: 10:00:00 via INTRAVENOUS

## 2016-01-04 MED ORDER — CHLORHEXIDINE GLUCONATE 4 % EX LIQD: 60.0000 mL | Freq: Once | CUTANEOUS | Status: DC

## 2016-01-04 MED ORDER — DEXAMETHASONE SODIUM PHOSPHATE 10 MG/ML IJ SOLN
INTRAMUSCULAR | Status: DC | PRN
  Administered 2016-01-04: 10 mg via INTRAVENOUS

## 2016-01-04 MED ORDER — ONDANSETRON HCL 4 MG/2ML IJ SOLN: 4.0000 mg | Freq: Four times a day (QID) | INTRAMUSCULAR | Status: DC | PRN

## 2016-01-04 MED ORDER — POLYETHYLENE GLYCOL 3350 17 G PO PACK: 17.0000 g | PACK | Freq: Every day | ORAL | Status: DC | PRN

## 2016-01-04 MED ORDER — PHENOL 1.4 % MT LIQD: 1.0000 | OROMUCOSAL | Status: DC | PRN

## 2016-01-04 MED ORDER — LIDOCAINE 2% (20 MG/ML) 5 ML SYRINGE
INTRAMUSCULAR | Status: AC
  Filled 2016-01-04: qty 5

## 2016-01-04 MED ORDER — SUCCINYLCHOLINE CHLORIDE 200 MG/10ML IV SOSY
PREFILLED_SYRINGE | INTRAVENOUS | Status: AC
  Filled 2016-01-04: qty 10

## 2016-01-04 MED ORDER — LACTATED RINGERS IV SOLN
INTRAVENOUS | Status: DC | PRN
  Administered 2016-01-04 (×2): via INTRAVENOUS

## 2016-01-04 MED ORDER — SODIUM CHLORIDE 0.9 % IV SOLN
INTRAVENOUS | Status: DC
  Administered 2016-01-04 – 2016-01-05 (×2): via INTRAVENOUS

## 2016-01-04 MED ORDER — PROPOFOL 500 MG/50ML IV EMUL
INTRAVENOUS | Status: DC | PRN
  Administered 2016-01-04: 140 ug/kg/min via INTRAVENOUS

## 2016-01-04 MED ORDER — METOCLOPRAMIDE HCL 5 MG PO TABS: 5.0000 mg | ORAL_TABLET | Freq: Three times a day (TID) | ORAL | Status: DC | PRN

## 2016-01-04 MED ORDER — EPHEDRINE SULFATE-NACL 50-0.9 MG/10ML-% IV SOSY
PREFILLED_SYRINGE | INTRAVENOUS | Status: DC | PRN
  Administered 2016-01-04: 5 mg via INTRAVENOUS

## 2016-01-04 MED ORDER — STERILE WATER FOR IRRIGATION IR SOLN
Status: DC | PRN
  Administered 2016-01-04: 2000 mL

## 2016-01-04 MED ORDER — METOCLOPRAMIDE HCL 5 MG/ML IJ SOLN: 10.0000 mg | Freq: Once | INTRAMUSCULAR | Status: DC | PRN

## 2016-01-04 MED ORDER — EPHEDRINE 5 MG/ML INJ
INTRAVENOUS | Status: AC
  Filled 2016-01-04: qty 10

## 2016-01-04 MED ORDER — BUPIVACAINE IN DEXTROSE 0.75-8.25 % IT SOLN
INTRATHECAL | Status: DC | PRN
  Administered 2016-01-04: 2 mL via INTRATHECAL

## 2016-01-04 MED ORDER — FENTANYL CITRATE (PF) 100 MCG/2ML IJ SOLN: 25.0000 ug | INTRAMUSCULAR | Status: DC | PRN

## 2016-01-04 MED ORDER — CEFAZOLIN IN D5W 1 GM/50ML IV SOLN
1.0000 g | Freq: Four times a day (QID) | INTRAVENOUS | Status: AC
  Administered 2016-01-04 (×2): 1 g via INTRAVENOUS
  Filled 2016-01-04 (×2): qty 50

## 2016-01-04 MED ORDER — FENTANYL CITRATE (PF) 100 MCG/2ML IJ SOLN
INTRAMUSCULAR | Status: AC
  Filled 2016-01-04: qty 2

## 2016-01-04 MED ORDER — FENTANYL CITRATE (PF) 100 MCG/2ML IJ SOLN
INTRAMUSCULAR | Status: DC | PRN
  Administered 2016-01-04: 100 ug via INTRAVENOUS

## 2016-01-04 SURGICAL SUPPLY — 38 items
BENZOIN TINCTURE PRP APPL 2/3 (GAUZE/BANDAGES/DRESSINGS) ×2 IMPLANT
BLADE SAW SGTL 18X1.27X75 (BLADE) ×2 IMPLANT
CAPT HIP TOTAL 2 ×2 IMPLANT
CELLS DAT CNTRL 66122 CELL SVR (MISCELLANEOUS) ×1 IMPLANT
CLOTH BEACON ORANGE TIMEOUT ST (SAFETY) ×2 IMPLANT
DRAPE STERI IOBAN 125X83 (DRAPES) ×2 IMPLANT
DRAPE U-SHAPE 47X51 STRL (DRAPES) ×4 IMPLANT
DRSG AQUACEL AG ADV 3.5X10 (GAUZE/BANDAGES/DRESSINGS) ×2 IMPLANT
DURAPREP 26ML APPLICATOR (WOUND CARE) ×2 IMPLANT
ELECT REM PT RETURN 9FT ADLT (ELECTROSURGICAL) ×2
ELECTRODE REM PT RTRN 9FT ADLT (ELECTROSURGICAL) ×1 IMPLANT
GLOVE BIO SURGEON STRL SZ 6.5 (GLOVE) ×2 IMPLANT
GLOVE BIO SURGEON STRL SZ7.5 (GLOVE) ×2 IMPLANT
GLOVE BIOGEL PI IND STRL 7.0 (GLOVE) ×1 IMPLANT
GLOVE BIOGEL PI IND STRL 7.5 (GLOVE) ×1 IMPLANT
GLOVE BIOGEL PI IND STRL 8 (GLOVE) ×2 IMPLANT
GLOVE BIOGEL PI INDICATOR 7.0 (GLOVE) ×1
GLOVE BIOGEL PI INDICATOR 7.5 (GLOVE) ×1
GLOVE BIOGEL PI INDICATOR 8 (GLOVE) ×2
GLOVE ECLIPSE 8.0 STRL XLNG CF (GLOVE) ×2 IMPLANT
GLOVE SURG SS PI 7.0 STRL IVOR (GLOVE) ×4 IMPLANT
GLOVE SURG SS PI 7.5 STRL IVOR (GLOVE) ×2 IMPLANT
GOWN STRL REUS W/ TWL XL LVL3 (GOWN DISPOSABLE) ×1 IMPLANT
GOWN STRL REUS W/TWL XL LVL3 (GOWN DISPOSABLE) ×7 IMPLANT
HANDPIECE INTERPULSE COAX TIP (DISPOSABLE) ×1
HOLDER FOLEY CATH W/STRAP (MISCELLANEOUS) ×2 IMPLANT
PACK ANTERIOR HIP CUSTOM (KITS) ×2 IMPLANT
RTRCTR WOUND ALEXIS 18CM MED (MISCELLANEOUS) ×2
SET HNDPC FAN SPRY TIP SCT (DISPOSABLE) ×1 IMPLANT
STRIP CLOSURE SKIN 1/2X4 (GAUZE/BANDAGES/DRESSINGS) ×2 IMPLANT
SUT ETHIBOND NAB CT1 #1 30IN (SUTURE) ×2 IMPLANT
SUT MNCRL AB 4-0 PS2 18 (SUTURE) ×2 IMPLANT
SUT VIC AB 0 CT1 36 (SUTURE) ×2 IMPLANT
SUT VIC AB 1 CT1 36 (SUTURE) ×2 IMPLANT
SUT VIC AB 2-0 CT1 27 (SUTURE) ×2
SUT VIC AB 2-0 CT1 TAPERPNT 27 (SUTURE) ×2 IMPLANT
TRAY FOLEY W/METER SILVER 16FR (SET/KITS/TRAYS/PACK) ×2 IMPLANT
YANKAUER SUCT BULB TIP 10FT TU (MISCELLANEOUS) ×2 IMPLANT

## 2016-01-04 NOTE — Progress Notes (Signed)
X-ray results noted 

## 2016-01-04 NOTE — Evaluation (Signed)
Physical Therapy Evaluation Patient Details Name: Noah Gonzalez MRN: [id number] DOB: 03/05/1970 Today's Date: 01/04/2016   History of Present Illness  Pt s/p L THR and with hx of R THR 1/15  Clinical Impression  Pt s/p L THR presents with decreased L LE strength/ROM and post op pain limiting functional mobility.  Pt should progress well to dc home with family assist.    Follow Up Recommendations No PT follow up (per pt)    Equipment Recommendations  Other (comment) (Spouse may be able to borrow equipment)    Recommendations for Other Services OT consult     Precautions / Restrictions Precautions Precautions: Fall Restrictions Weight Bearing Restrictions: No Other Position/Activity Restrictions: WBAT      Mobility  Bed Mobility Overal bed mobility: Needs Assistance Bed Mobility: Supine to Sit     Supine to sit: Min guard     General bed mobility comments: cues for sequence and use of R LE to self assist  Transfers Overall transfer level: Needs assistance Equipment used: Rolling walker (2 wheeled) Transfers: Sit to/from Stand Sit to Stand: Min assist         General transfer comment: cues for LE management and use of UEs to self assist  Ambulation/Gait Ambulation/Gait assistance: Min assist;Min guard Ambulation Distance (Feet): 150 Feet Assistive device: Rolling walker (2 wheeled) Gait Pattern/deviations: Step-to pattern;Step-through pattern;Decreased step length - right;Decreased step length - left;Shuffle;Trunk flexed Gait velocity: decr Gait velocity interpretation: Below normal speed for age/gender General Gait Details: cues for posture, position from RW and initial sequence  Stairs            Wheelchair Mobility    Modified Rankin (Stroke Patients Only)       Balance                                             Pertinent Vitals/Pain Pain Assessment: 0-10 Pain Score: 4  Pain Location: L hip Pain Descriptors /  Indicators: Aching;Sore Pain Intervention(s): Limited activity within patient's tolerance;Monitored during session;Premedicated before session;Ice applied    Home Living Family/patient expects to be discharged to:: Private residence Living Arrangements: Spouse/significant other Available Help at Discharge: Family Type of Home: House Home Access: Stairs to enter Entrance Stairs-Rails: Left Entrance Stairs-Number of Steps: 3 Home Layout: Able to live on main level with bedroom/bathroom Home Equipment: None Additional Comments: Pts spouse may be able to borrow equipment    Prior Function Level of Independence: Independent               Hand Dominance   Dominant Hand: Right    Extremity/Trunk Assessment   Upper Extremity Assessment Upper Extremity Assessment: Overall WFL for tasks assessed    Lower Extremity Assessment Lower Extremity Assessment: LLE deficits/detail    Cervical / Trunk Assessment Cervical / Trunk Assessment: Normal  Communication   Communication: No difficulties  Cognition Arousal/Alertness: Awake/alert Behavior During Therapy: WFL for tasks assessed/performed Overall Cognitive Status: Within Functional Limits for tasks assessed                      General Comments      Exercises Total Joint Exercises Ankle Circles/Pumps: AROM;Both;15 reps;Supine   Assessment/Plan    PT Assessment Patient needs continued PT services  PT Problem List Decreased strength;Decreased range of motion;Decreased activity tolerance;Decreased mobility;Decreased knowledge of use of DME;Pain  PT Treatment Interventions DME instruction;Gait training;Stair training;Functional mobility training;Therapeutic activities;Therapeutic exercise;Patient/family education    PT Goals (Current goals can be found in the Care Plan section)  Acute Rehab PT Goals Patient Stated Goal: Regain IND and get back to the gym PT Goal Formulation: With patient Time For Goal  Achievement: 01/08/16 Potential to Achieve Goals: Good    Frequency 7X/week   Barriers to discharge        Co-evaluation               End of Session Equipment Utilized During Treatment: Gait belt Activity Tolerance: Patient tolerated treatment well Patient left: in chair;with call bell/phone within reach;with nursing/sitter in room Nurse Communication: Mobility status         Time: [id number] PT Time Calculation (min) (ACUTE ONLY): 33 min   Charges:   PT Evaluation $PT Eval Low Complexity: 1 Procedure PT Treatments $Gait Training: 8-22 mins   PT G Codes:        Olga Seyler 01-06-16, 4:57 PM

## 2016-01-04 NOTE — Anesthesia Procedure Notes (Signed)
Spinal  Patient location during procedure: OR Start time: 01/04/2016 7:19 AM End time: 01/04/2016 7:24 AM Staffing Resident/CRNA: Danley Danker L Performed: resident/CRNA  Preanesthetic Checklist Completed: patient identified, site marked, surgical consent, pre-op evaluation, timeout performed, IV checked, risks and benefits discussed and monitors and equipment checked Spinal Block Patient position: sitting Prep: Betadine Patient monitoring: heart rate, continuous pulse ox and blood pressure Approach: midline Location: L3-4 Injection technique: single-shot Needle Needle type: Pencan  Needle gauge: 24 G Needle length: 10 cm Assessment Sensory level: T6 Additional Notes Kit expiration date 05/12/2017 and lot #[id number] Clear, free flowing CSF, negative heme, negative paresthesia Returned to supine, tolerated well

## 2016-01-04 NOTE — Transfer of Care (Signed)
Immediate Anesthesia Transfer of Care Note  Patient: Noah Gonzalez  Procedure(s) Performed: Procedure(s): LEFT TOTAL HIP ARTHROPLASTY ANTERIOR APPROACH (Left)  Patient Location: PACU  Anesthesia Type:Spinal  Level of Consciousness: awake  Airway & Oxygen Therapy: Patient Spontanous Breathing and Patient connected to face mask oxygen  Post-op Assessment: Report given to RN and Post -op Vital signs reviewed and stable  Post vital signs: Reviewed and stable  Last Vitals:  Vitals:   01/04/16 0548  BP: (!) 147/93  Pulse: 69  Resp: 18  Temp: 36.4 C    Last Pain:  Vitals:   01/04/16 0548  TempSrc: Oral         Complications: No apparent anesthesia complications

## 2016-01-04 NOTE — H&P (Signed)
TOTAL HIP ADMISSION H&P  Patient is admitted for left total hip arthroplasty.  Subjective:  Chief Complaint: left hip pain  HPI: Noah Gonzalez, 45 y.o. male, has a history of pain and functional disability in the left hip(s) due to arthritis and patient has failed non-surgical conservative treatments for greater than 12 weeks to include NSAID's and/or analgesics, corticosteriod injections, flexibility and strengthening excercises, weight reduction as appropriate and activity modification.  Onset of symptoms was gradual starting 3 years ago with gradually worsening course since that time.The patient noted no past surgery on the left hip(s).  Patient currently rates pain in the left hip at 10 out of 10 with activity. Patient has night pain, worsening of pain with activity and weight bearing, pain that interfers with activities of daily living and pain with passive range of motion. Patient has evidence of subchondral sclerosis, periarticular osteophytes and joint space narrowing by imaging studies. This condition presents safety issues increasing the risk of falls.  There is no current active infection.  Patient Active Problem List   Diagnosis Date Noted  . Unilateral primary osteoarthritis, left hip 01/04/2016  . Arthritis pain of right hip 01/21/2013  . Status post THR (total hip replacement) 01/21/2013   Past Medical History:  Diagnosis Date  . Arthritis    osteoarthritis-hips  . GERD (gastroesophageal reflux disease)    12/26/15- states has resolved  . Hypertension    history of- has lost 40 lbs  . Low serum testosterone level    recent tx. done    Past Surgical History:  Procedure Laterality Date  . TOTAL HIP ARTHROPLASTY Right 01/21/2013   Procedure: RIGHT TOTAL HIP ARTHROPLASTY ANTERIOR APPROACH;  Surgeon: Mcarthur Rossetti, MD;  Location: [facility];  Service: Orthopedics;  Laterality: Right;  . WISDOM TOOTH EXTRACTION      Prescriptions Prior to Admission  Medication Sig  Dispense Refill Last Dose  . acetaminophen (TYLENOL) 325 MG tablet Take 650 mg by mouth every 6 (six) hours as needed for moderate pain.     . cyclobenzaprine (FLEXERIL) 5 MG tablet Take 5 mg by mouth 3 (three) times daily as needed for muscle spasms.   01/03/2016 at Unknown time  . ibuprofen (ADVIL,MOTRIN) 200 MG tablet Take 800 mg by mouth every 6 (six) hours as needed for moderate pain.   01/03/2016 at Unknown time  . MELATONIN PO Take 1 tablet by mouth at bedtime.   01/03/2016 at hs  . tadalafil (CIALIS) 5 MG tablet Take 5 mg by mouth as needed for erectile dysfunction.      Marland Kitchen testosterone cypionate (DEPOTESTOTERONE CYPIONATE) 200 MG/ML injection Inject 100 mg into the muscle every 7 (seven) days. Saturdays   Taking  . traMADol (ULTRAM) 50 MG tablet Take 2 tablets (100 mg total) by mouth every 12 (twelve) hours as needed. (Patient taking differently: Take 100 mg by mouth every 12 (twelve) hours as needed for moderate pain. ) 60 tablet 0 01/03/2016 at Unknown time  . HYDROcodone-acetaminophen (NORCO/VICODIN) 5-325 MG per tablet Take 2 tablets by mouth every 4 (four) hours as needed. (Patient not taking: Reported on 11/21/2015) 10 tablet 0 Not Taking  . ibuprofen (ADVIL,MOTRIN) 800 MG tablet Take 1 tablet (800 mg total) by mouth every 8 (eight) hours as needed. (Patient not taking: Reported on 12/25/2015) 60 tablet 3 Not Taking at Unknown time  . naproxen (NAPROSYN) 500 MG tablet Take 1 tablet (500 mg total) by mouth 2 (two) times daily with a meal. (Patient not taking:  Reported on 12/25/2015) 30 tablet 0 Not Taking at Unknown time   No Known Allergies  Social History  Substance Use Topics  . Smoking status: Former Smoker    Years: 2.00    Types: Cigarettes    Quit date: 01/17/2005  . Smokeless tobacco: Former Systems developer    Types: Chew     Comment: quit 6 yrs ago  . Alcohol use No    History reviewed. No pertinent family history.   Review of Systems  Musculoskeletal: Positive for joint pain.   All other systems reviewed and are negative.   Objective:  Physical Exam  Constitutional: He is oriented to person, place, and time. He appears well-developed and well-nourished.  HENT:  Head: Normocephalic and atraumatic.  Eyes: EOM are normal. Pupils are equal, round, and reactive to light.  Neck: Normal range of motion. Neck supple.  Cardiovascular: Normal rate and regular rhythm.   Respiratory: Effort normal and breath sounds normal.  GI: Soft. Bowel sounds are normal.  Musculoskeletal:       Left hip: He exhibits decreased range of motion, decreased strength, tenderness and bony tenderness.  Neurological: He is alert and oriented to person, place, and time.  Skin: Skin is warm and dry.  Psychiatric: He has a normal mood and affect.    Vital signs in last 24 hours: Temp:  [97.6 F (36.4 C)] 97.6 F (36.4 C) (12/22 0548) Pulse Rate:  [69] 69 (12/22 0548) Resp:  [18] 18 (12/22 0548) BP: (147)/(93) 147/93 (12/22 0548) SpO2:  [97 %] 97 % (12/22 0548) Weight:  [215 lb (97.5 kg)] 215 lb (97.5 kg) (12/22 0543)  Labs:   Estimated body mass index is 31.75 kg/m as calculated from the following:   Height as of this encounter: 5\' 9"  (1.753 m).   Weight as of this encounter: 215 lb (97.5 kg).   Imaging Review Plain radiographs demonstrate severe degenerative joint disease of the left hip(s). The bone quality appears to be excellent for age and reported activity level.  Assessment/Plan:  End stage arthritis, left hip(s)  The patient history, physical examination, clinical judgement of the provider and imaging studies are consistent with end stage degenerative joint disease of the left hip(s) and total hip arthroplasty is deemed medically necessary. The treatment options including medical management, injection therapy, arthroscopy and arthroplasty were discussed at length. The risks and benefits of total hip arthroplasty were presented and reviewed. The risks due to aseptic  loosening, infection, stiffness, dislocation/subluxation,  thromboembolic complications and other imponderables were discussed.  The patient acknowledged the explanation, agreed to proceed with the plan and consent was signed. Patient is being admitted for inpatient treatment for surgery, pain control, PT, OT, prophylactic antibiotics, VTE prophylaxis, progressive ambulation and ADL's and discharge planning.The patient is planning to be discharged home with home health services

## 2016-01-04 NOTE — Op Note (Signed)
Noah Gonzalez, Noah Gonzalez NO.:  [id number]  MEDICAL RECORD NO.:  [id number]  LOCATION:  [facility]                         FACILITY:  [facility]  PHYSICIAN:  Lind Guest. Ninfa Linden, M.D.DATE OF BIRTH:  03/03/1970  DATE OF PROCEDURE:  01/04/2016 DATE OF DISCHARGE:  08/24/2015                              OPERATIVE REPORT   PREOPERATIVE DIAGNOSIS:  Primary osteoarthritis and degenerative joint disease, left hip.  POSTOPERATIVE DIAGNOSIS:  Primary osteoarthritis and degenerative joint disease, left hip.  PROCEDURE:  Left total hip arthroplasty through direct anterior approach.  IMPLANTS:  DePuy Sector Gription acetabular component size 54 with a single screw, size 36+ 4 neutral polyethylene liner, size 11 Corail femoral component with varus offset, size 36+ 1.5 ceramic hip ball.  SURGEON:  Lind Guest. Ninfa Linden, M.D.  ASSISTANT:  Erskine Emery, PA-C.  ANESTHESIA:  Spinal.  ANTIBIOTICS:  2 g of IV Ancef.  BLOOD LOSS:  350 mL.  COMPLICATIONS:  None.  INDICATIONS:  Mr. Sofia is a 45 year old gentleman, well known to me. He has severe femoral acetabular impingement and degenerative joint disease of his both hips, but his right hip underwent a successful total hip arthroplasty in January 2015.  His left hip has been hurting quite a bit.  His pain is daily and it detrimentally affect his activities daily living, his quality of life, and his mobility.  At this point, he does wish to proceed with a total hip arthroplasty through direct anterior approach on the left side.  He has tried and failed all forms of conservative treatment.  He understands our goals are decreased pain, improved mobility, and overall improved quality of life.  PROCEDURE DESCRIPTION:  After informed consent was obtained, appropriate left hip was marked.  He was brought to the operating room.  Spinal anesthesia was obtained while he was on a stretcher.  A Foley catheter was placed and both feet  had traction boots applied to them.  Next, he was placed supine on the Hana fracture table.  The perineal post in place and both legs in inline skeletal traction devices, no traction applied.  His left operative hip was prepped and draped with DuraPrep and sterile drapes.  Time-out was called to identify correct patient and correct left hip.  We then made an incision just inferior and posterior to the anterior superior iliac spine and carried this obliquely down the leg.  We dissected down the tensor fascia lata muscle and tensor fascia was then divided longitudinally, so we could proceed with direct anterior approach to the hip.  We identified and cauterized the circumflex vessels, then identified the hip capsule.  I opened up the hip capsule finding a moderate joint effusion and significant periarticular osteophytes.  We placed Cobra retractor on the medial and lateral femoral neck and then made our femoral neck cut with an oscillating saw proximal to the lesser trochanter and completed this with an osteotome.  We placed a corkscrew guide in the femoral head and removed the femoral head in its entirety and found it to be completely devoid of cartilage.  We then placed a bent Hohmann over the medial acetabular rim and removed remnants of acetabular labrum.  We then began  reaming from a size 42 reamer in stepwise increments up to a size 54 with all reamers under direct visualization and the last reamer under direct fluoroscopy, so we could obtain our depth of reaming, our inclination and anteversion.  Once we were pleased with this, we placed the real DePuy Sector Gription acetabular component size 54 and a single screw.  We placed a 36+ 4 polyethylene liner for that size acetabular component.  Attention was then turned to the femur.  With the leg externally rotated to 120 degrees, extended and adducted, we were to place a Mueller retractor medially and a Hohmann retractor behind  the greater trochanter.  We released the lateral joint capsule and used a box cutting osteotome to enter femoral canal and a rongeur to lateralize.  We then began broaching from a size 8 broach using the Corail broaching system up to a size 11.  Based on his anatomy, with 11 stem in place, we trialed a varus offset femoral neck and a 36+ 1.5 hip ball.  We brought the leg back over and up with traction and rotation reducing the pelvis.  We were pleased with leg length, offset, stability, and range of motion.  We then dislocated the hip and removed the trial components.  We placed the real Corail femoral component with varus offset size 11 and the real 36+ 1.5 ceramic hip ball and reduced the standard acetabulum, again it was stable.  We then irrigated the soft tissue with normal saline solution using pulsatile lavage.  We closed the joint capsule with just only a small amount to close with interrupted #1 Ethibond suture.  We closed the tensor fascia with a running #1 Vicryl followed by 0 Vicryl in the deep tissue, 2-0 Vicryl in the subcutaneous tissue, 4-0 Monocryl subcuticular stitch and Steri- Strips on the skin.  An Aquacel dressing was applied.  He was taken off the Hana table and taken to recovery room in stable condition.  All final counts were correct.  There were no complications noted.  Of note, Erskine Emery, PA-C assisted in the entire case.  His assistance was crucial for facilitating all aspects of this case.     Lind Guest. Ninfa Linden, M.D.     CYB/MEDQ  D:  01/04/2016  T:  01/04/2016  Job:  [id number]

## 2016-01-04 NOTE — Brief Op Note (Signed)
01/04/2016  8:46 AM  PATIENT:  Noah Gonzalez  45 y.o. male  PRE-OPERATIVE DIAGNOSIS:  Endstage osteoarthritis left hip  POST-OPERATIVE DIAGNOSIS:  Endstage osteoarthritis left hip  PROCEDURE:  Procedure(s): LEFT TOTAL HIP ARTHROPLASTY ANTERIOR APPROACH (Left)  SURGEON:  Surgeon(s) and Role:    * Mcarthur Rossetti, MD - Primary  PHYSICIAN ASSISTANT: Benita Stabile, PA-C  ANESTHESIA:   spinal  EBL:  Total I/O In: 1000 [I.V.:1000] Out: 550 [Urine:200; Blood:350]  COUNTS:  YES  DICTATION: .Other Dictation: Dictation Number [id number]  PLAN OF CARE: Admit to inpatient   PATIENT DISPOSITION:  PACU - hemodynamically stable.   Delay start of Pharmacological VTE agent (>24hrs) due to surgical blood loss or risk of bleeding: no

## 2016-01-04 NOTE — Anesthesia Preprocedure Evaluation (Signed)
Anesthesia Evaluation  Patient identified by MRN, date of birth, ID band Patient awake    Reviewed: Allergy & Precautions, H&P , NPO status , Patient's Chart, lab work & pertinent test results  Airway Mallampati: II  TM Distance: >3 FB Neck ROM: Full    Dental  (+) Dental Advisory Given   Pulmonary former smoker,    breath sounds clear to auscultation       Cardiovascular hypertension, negative cardio ROS   Rhythm:Regular Rate:Normal     Neuro/Psych negative neurological ROS  negative psych ROS   GI/Hepatic Neg liver ROS, GERD  Medicated,  Endo/Other  negative endocrine ROS  Renal/GU negative Renal ROS     Musculoskeletal negative musculoskeletal ROS (+)   Abdominal   Peds  Hematology negative hematology ROS (+)   Anesthesia Other Findings   Reproductive/Obstetrics negative OB ROS                             Anesthesia Physical  Anesthesia Plan  ASA: II  Anesthesia Plan: Spinal   Post-op Pain Management:    Induction: Intravenous  Airway Management Planned:   Additional Equipment:   Intra-op Plan:   Post-operative Plan:   Informed Consent: I have reviewed the patients History and Physical, chart, labs and discussed the procedure including the risks, benefits and alternatives for the proposed anesthesia with the patient or authorized representative who has indicated his/her understanding and acceptance.   Dental advisory given  Plan Discussed with: CRNA  Anesthesia Plan Comments:         Anesthesia Quick Evaluation

## 2016-01-04 NOTE — Anesthesia Postprocedure Evaluation (Addendum)
Anesthesia Post Note  Patient: Noah Gonzalez  Procedure(s) Performed: Procedure(s) (LRB): LEFT TOTAL HIP ARTHROPLASTY ANTERIOR APPROACH (Left)  Patient location during evaluation: PACU Anesthesia Type: Spinal Level of consciousness: awake and alert Pain management: pain level controlled Vital Signs Assessment: post-procedure vital signs reviewed and stable Respiratory status: spontaneous breathing and respiratory function stable Cardiovascular status: blood pressure returned to baseline and stable Postop Assessment: no headache, no backache and spinal receding Anesthetic complications: no       Last Vitals:  Vitals:   01/04/16 0548  BP: (!) 147/93  Pulse: 69  Resp: 18  Temp: 36.4 C    Last Pain:  Vitals:   01/04/16 0548  TempSrc: Oral                 [name]

## 2016-01-04 NOTE — Progress Notes (Signed)
Portable AP Pelvis X-ray done. 

## 2016-01-04 NOTE — Op Note (Deleted)
  The note originally documented on this encounter has been moved the the encounter in which it belongs.  

## 2016-01-05 LAB — CBC
HEMATOCRIT: 39.4 % (ref 39.0–52.0)
Hemoglobin: 13.9 g/dL (ref 13.0–17.0)
MCH: 31 pg (ref 26.0–34.0)
MCHC: 35.3 g/dL (ref 30.0–36.0)
MCV: 87.8 fL (ref 78.0–100.0)
Platelets: 269 10*3/uL (ref 150–400)
RBC: 4.49 MIL/uL (ref 4.22–5.81)
RDW: 12.3 % (ref 11.5–15.5)
WBC: 14 10*3/uL — AB (ref 4.0–10.5)

## 2016-01-05 LAB — BASIC METABOLIC PANEL
ANION GAP: 8 (ref 5–15)
BUN: 15 mg/dL (ref 6–20)
CO2: 26 mmol/L (ref 22–32)
Calcium: 9.1 mg/dL (ref 8.9–10.3)
Chloride: 104 mmol/L (ref 101–111)
Creatinine, Ser: 1.05 mg/dL (ref 0.61–1.24)
GFR calc Af Amer: >60 mL/min (ref >60)
GFR calc non Af Amer: >60 mL/min (ref >60)
GLUCOSE: 124 mg/dL — AB (ref 65–99)
POTASSIUM: 4.2 mmol/L (ref 3.5–5.1)
Sodium: 138 mmol/L (ref 135–145)

## 2016-01-05 MED ORDER — DIAZEPAM 5 MG PO TABS: 5.0000 mg | ORAL_TABLET | Freq: Three times a day (TID) | ORAL | 0 refills | Status: DC | PRN

## 2016-01-05 MED ORDER — ASPIRIN 81 MG PO CHEW: 81.0000 mg | CHEWABLE_TABLET | Freq: Two times a day (BID) | ORAL | 0 refills | Status: DC

## 2016-01-05 MED ORDER — OXYCODONE HCL 5 MG PO TABS: 5.0000 mg | ORAL_TABLET | ORAL | 0 refills | Status: DC | PRN

## 2016-01-05 NOTE — Progress Notes (Signed)
OT Cancellation Note  Patient Details Name: Noah Gonzalez MRN: [id number] DOB: November 15, 1970   Cancelled Treatment:    Reason Eval/Treat Not Completed: OT screened, no needs identified, will sign off.  Pt had R hip done 2 years ago. Reviewed tub readiness, and use of 3:1 against long side of wall as alternative if pt cannot tolerate sidestepping into tub initially.  Sheily Lineman 01/05/2016, 2:38 PM  Lesle Chris, OTR/L [phone] 01/05/2016

## 2016-01-05 NOTE — Discharge Instructions (Signed)

## 2016-01-05 NOTE — Progress Notes (Signed)
Physical Therapy Treatment Patient Details Name: Noah Gonzalez MRN: [id number] DOB: 04/18/1970 Today's Date: 01/05/2016    History of Present Illness Pt s/p L THR and with hx of R THR 1/15    PT Comments    Pt continues motivated but with decreased activity tolerance 2* increased "tighness".  RN aware and providing muscle relax.  Follow Up Recommendations  No PT follow up     Equipment Recommendations  Rolling walker with 5" wheels    Recommendations for Other Services OT consult     Precautions / Restrictions Precautions Precautions: Fall Restrictions Weight Bearing Restrictions: No Other Position/Activity Restrictions: WBAT    Mobility  Bed Mobility Overal bed mobility: Needs Assistance Bed Mobility: Supine to Sit;Sit to Supine     Supine to sit: Min guard Sit to supine: Min assist   General bed mobility comments: cues for sequence and use of R LE to self assist  Transfers Overall transfer level: Needs assistance Equipment used: Rolling walker (2 wheeled) Transfers: Sit to/from Stand Sit to Stand: Supervision         General transfer comment: min cues for LE management and use of UEs to self assist  Ambulation/Gait Ambulation/Gait assistance: Min guard;Supervision Ambulation Distance (Feet): 400 Feet Assistive device: Rolling walker (2 wheeled) Gait Pattern/deviations: Step-through pattern;Decreased step length - left;Shuffle;Trunk flexed Gait velocity: decr Gait velocity interpretation: Below normal speed for age/gender General Gait Details: cues for posture, position from RW and initial sequence   Stairs Stairs: Yes   Stair Management: No rails;Step to pattern;Backwards;With walker Number of Stairs: 4 General stair comments: cues for sequence and foot/RW placement  Wheelchair Mobility    Modified Rankin (Stroke Patients Only)       Balance                                    Cognition Arousal/Alertness:  Awake/alert Behavior During Therapy: WFL for tasks assessed/performed Overall Cognitive Status: Within Functional Limits for tasks assessed                      Exercises      General Comments        Pertinent Vitals/Pain Pain Assessment: 0-10 Pain Score: 5  Pain Location: L hip/thigh Pain Descriptors / Indicators: Aching;Sore;Tightness Pain Intervention(s): Limited activity within patient's tolerance;Monitored during session;Premedicated before session;Ice applied    Home Living                      Prior Function            PT Goals (current goals can now be found in the care plan section) Acute Rehab PT Goals Patient Stated Goal: Regain IND and get back to the gym PT Goal Formulation: With patient Time For Goal Achievement: 01/08/16 Potential to Achieve Goals: Good Progress towards PT goals: Progressing toward goals    Frequency    7X/week      PT Plan Current plan remains appropriate    Co-evaluation             End of Session Equipment Utilized During Treatment: Gait belt Activity Tolerance: Patient tolerated treatment well Patient left: in bed;with call bell/phone within reach     Time: 1500-1518 PT Time Calculation (min) (ACUTE ONLY): 18 min  Charges:  $Gait Training: 8-22 mins  G Codes:      Ireland Chagnon 01-07-2016, 5:15 PM

## 2016-01-05 NOTE — Progress Notes (Signed)
Subjective: 1 Day Post-Op Procedure(s) (LRB): LEFT TOTAL HIP ARTHROPLASTY ANTERIOR APPROACH (Left) Patient reports pain as moderate.    Objective: Vital signs in last 24 hours: Temp:  [97.6 F (36.4 C)-98.5 F (36.9 C)] 97.9 F (36.6 C) (12/23 0556) Pulse Rate:  [55-90] 82 (12/23 0556) Resp:  [12-16] 16 (12/23 0556) BP: (112-180)/(59-99) 135/73 (12/23 0633) SpO2:  [91 %-100 %] 96 % (12/23 0556)  Intake/Output from previous day: 12/22 0701 - 12/23 0700 In: 4155 [P.O.:720; I.V.:3275; IV Piggyback:160] Out: [id number] [Urine:4500; Blood:350] Intake/Output this shift: No intake/output data recorded.   Recent Labs  01/05/16 0529  HGB 13.9    Recent Labs  01/05/16 0529  WBC 14.0*  RBC 4.49  HCT 39.4  PLT 269    Recent Labs  01/05/16 0529  NA 138  K 4.2  CL 104  CO2 26  BUN 15  CREATININE 1.05  GLUCOSE 124*  CALCIUM 9.1   No results for input(s): LABPT, INR in the last 72 hours.  Sensation intact distally Intact pulses distally Dorsiflexion/Plantar flexion intact Incision: dressing C/D/I  Assessment/Plan: 1 Day Post-Op Procedure(s) (LRB): LEFT TOTAL HIP ARTHROPLASTY ANTERIOR APPROACH (Left) Up with therapy Plan for discharge tomorrow Discharge home with home health  Noah Gonzalez 01/05/2016, 9:18 AM

## 2016-01-05 NOTE — Progress Notes (Signed)
Physical Therapy Treatment Patient Details Name: Noah Gonzalez MRN: [id number] DOB: 10-Aug-1970 Today's Date: 01/05/2016    History of Present Illness Pt s/p L THR and with hx of R THR 1/15    PT Comments    Pt very motivated and progressing well with mobility.  Pt initiated therex, ambulated in halls and up in bathroom for toileting, hygiene and brushing teeth.  Follow Up Recommendations  No PT follow up     Equipment Recommendations  Rolling walker with 5" wheels    Recommendations for Other Services OT consult     Precautions / Restrictions Precautions Precautions: Fall Restrictions Weight Bearing Restrictions: No Other Position/Activity Restrictions: WBAT    Mobility  Bed Mobility Overal bed mobility: Needs Assistance Bed Mobility: Supine to Sit;Sit to Supine     Supine to sit: Min guard Sit to supine: Min assist   General bed mobility comments: cues for sequence and use of R LE to self assist  Transfers Overall transfer level: Needs assistance Equipment used: Rolling walker (2 wheeled) Transfers: Sit to/from Stand Sit to Stand: Min guard         General transfer comment: cues for LE management and use of UEs to self assist  Ambulation/Gait Ambulation/Gait assistance: Min guard;Supervision Ambulation Distance (Feet): 600 Feet Assistive device: Rolling walker (2 wheeled) Gait Pattern/deviations: Step-through pattern;Decreased step length - left;Shuffle;Trunk flexed Gait velocity: decr Gait velocity interpretation: Below normal speed for age/gender General Gait Details: cues for posture, position from RW and initial sequence   Stairs            Wheelchair Mobility    Modified Rankin (Stroke Patients Only)       Balance Overall balance assessment: No apparent balance deficits (not formally assessed)                                  Cognition Arousal/Alertness: Awake/alert Behavior During Therapy: WFL for tasks  assessed/performed Overall Cognitive Status: Within Functional Limits for tasks assessed                      Exercises Total Joint Exercises Ankle Circles/Pumps: AROM;Both;15 reps;Supine Quad Sets: AROM;Both;10 reps;Supine Heel Slides: AAROM;Left;20 reps;10 reps;Supine Hip ABduction/ADduction: AAROM;Left;20 reps;Supine    General Comments        Pertinent Vitals/Pain Pain Assessment: 0-10 Pain Score: 4  Pain Location: L hip Pain Descriptors / Indicators: Aching;Sore Pain Intervention(s): Limited activity within patient's tolerance;Monitored during session;Premedicated before session;Ice applied    Home Living                      Prior Function            PT Goals (current goals can now be found in the care plan section) Acute Rehab PT Goals Patient Stated Goal: Regain IND and get back to the gym PT Goal Formulation: With patient Time For Goal Achievement: 01/08/16 Potential to Achieve Goals: Good Progress towards PT goals: Progressing toward goals    Frequency    7X/week      PT Plan Current plan remains appropriate    Co-evaluation             End of Session Equipment Utilized During Treatment: Gait belt Activity Tolerance: Patient tolerated treatment well Patient left: in bed;with call bell/phone within reach     Time: 1027-1108 PT Time Calculation (min) (ACUTE ONLY): 41 min  Charges:  $  Gait Training: 8-22 mins $Therapeutic Exercise: 8-22 mins $Therapeutic Activity: 8-22 mins                    G Codes:      Loriana Samad Jan 30, 2016, 12:11 PM

## 2016-01-05 NOTE — Care Management Note (Signed)
Case Management Note  Patient Details  Name: Noah Gonzalez MRN: [id number] Date of Birth: 06-24-1970  Subjective/Objective:                  LEFT TOTAL HIP ARTHROPLASTY ANTERIOR APPROACH (Left) Action/Plan: Discharge planning Expected Discharge Date:  01/05/16               Expected Discharge Plan:  Home/Self Care  In-House Referral:     Discharge planning Services  CM Consult  Post Acute Care Choice:  Durable Medical Equipment Choice offered to:  Patient  DME Arranged:  3-N-1, Walker rolling DME Agency:  [place]:  NA [place] Agency:  NA  Status of Service:  Completed, signed off  If discussed at [place] of Stay Meetings, dates discussed:    Additional Comments: CM spoke with pt  Concerning DMe needs. Cm notified [place] DME rep, reggie to please delive rthe rolling walker and 3n1 to room prior to dishcarge. No other CM needs were communicated. Dellie Catholic, RN 01/05/2016, 8:14 AM

## 2016-01-06 NOTE — Progress Notes (Signed)
Assessment unchanged. Pt verbalized understanding of dc instructions through teach back including follow up care and when to call the doctor. Scripts given as provided by MD. Home health care arranged by CM and DME at bedside for home. Discharged via wc to front entrance accompanied by friend and NT.

## 2016-01-06 NOTE — Progress Notes (Signed)
Subjective: 2 Days Post-Op Procedure(s) (LRB): LEFT TOTAL HIP ARTHROPLASTY ANTERIOR APPROACH (Left) Patient reports pain as moderate.    Objective: Vital signs in last 24 hours: Temp:  [98.7 F (37.1 C)-100.8 F (38.2 C)] 98.9 F (37.2 C) (12/24 1100) Pulse Rate:  [70-84] 70 (12/24 1100) Resp:  [14-18] 16 (12/24 1100) BP: (135-165)/(85-100) 135/90 (12/24 1100) SpO2:  [94 %-98 %] 95 % (12/24 1100)  Intake/Output from previous day: 12/23 0701 - 12/24 0700 In: 1516.3 [P.O.:960; I.V.:556.3] Out: 4725 [Urine:4725] Intake/Output this shift: Total I/O In: 1250 [P.O.:1250] Out: 1100 [Urine:1100]   Recent Labs  01/05/16 0529  HGB 13.9    Recent Labs  01/05/16 0529  WBC 14.0*  RBC 4.49  HCT 39.4  PLT 269    Recent Labs  01/05/16 0529  NA 138  K 4.2  CL 104  CO2 26  BUN 15  CREATININE 1.05  GLUCOSE 124*  CALCIUM 9.1   No results for input(s): LABPT, INR in the last 72 hours.  Intact pulses distally Dorsiflexion/Plantar flexion intact Incision: dressing C/D/I No cellulitis present Compartment soft  Assessment/Plan: 2 Days Post-Op Procedure(s) (LRB): LEFT TOTAL HIP ARTHROPLASTY ANTERIOR APPROACH (Left) Up with therapy Discharge home with home health today.  Noah Gonzalez 01/06/2016, 1:04 PM

## 2016-01-06 NOTE — Discharge Summary (Signed)
Patient ID: Noah Gonzalez MRN: [id number] DOB/AGE: Jun 13, 1970 45 y.o.  Admit date: 01/04/2016 Discharge date: 01/06/2016  Admission Diagnoses:  Principal Problem:   Unilateral primary osteoarthritis, left hip Active Problems:   Status post left hip replacement   Discharge Diagnoses:  Same  Past Medical History:  Diagnosis Date  . Arthritis    osteoarthritis-hips  . GERD (gastroesophageal reflux disease)    12/26/15- states has resolved  . Hypertension    history of- has lost 40 lbs  . Low serum testosterone level    recent tx. done    Surgeries: Procedure(s): LEFT TOTAL HIP ARTHROPLASTY ANTERIOR APPROACH on 01/04/2016   Consultants:   Discharged Condition: Improved  Hospital Course: Noah Gonzalez is an 45 y.o. male who was admitted 01/04/2016 for operative treatment ofUnilateral primary osteoarthritis, left hip. Patient has severe unremitting pain that affects sleep, daily activities, and work/hobbies. After pre-op clearance the patient was taken to the operating room on 01/04/2016 and underwent  Procedure(s): LEFT TOTAL HIP ARTHROPLASTY ANTERIOR APPROACH.    Patient was given perioperative antibiotics: Anti-infectives    Start     Dose/Rate Route Frequency Ordered Stop   01/04/16 1200  ceFAZolin (ANCEF) IVPB 1 g/50 mL premix     1 g 100 mL/hr over 30 Minutes Intravenous Every 6 hours 01/04/16 1047 01/04/16 1800   01/04/16 0542  ceFAZolin (ANCEF) IVPB 2g/100 mL premix     2 g 200 mL/hr over 30 Minutes Intravenous On call to O.R. 01/04/16 0542 01/04/16 0727       Patient was given sequential compression devices, early ambulation, and chemoprophylaxis to prevent DVT.  Patient benefited maximally from hospital stay and there were no complications.    Recent vital signs: Patient Vitals for the past 24 hrs:  BP Temp Temp src Pulse Resp SpO2  01/06/16 1100 135/90 98.9 F (37.2 C) Oral 70 16 95 %  01/06/16 0503 (!) 147/100 100 F (37.8 C) Oral 84 18 94 %   01/05/16 2040 (!) 149/85 (!) 100.8 F (38.2 C) Oral 83 18 98 %  01/05/16 1415 (!) 165/99 98.7 F (37.1 C) Oral 79 14 96 %     Recent laboratory studies:  Recent Labs  01/05/16 0529  WBC 14.0*  HGB 13.9  HCT 39.4  PLT 269  NA 138  K 4.2  CL 104  CO2 26  BUN 15  CREATININE 1.05  GLUCOSE 124*  CALCIUM 9.1     Discharge Medications:   Allergies as of 01/06/2016   No Known Allergies     Medication List    STOP taking these medications   cyclobenzaprine 5 MG tablet Commonly known as:  FLEXERIL   HYDROcodone-acetaminophen 5-325 MG tablet Commonly known as:  NORCO/VICODIN   ibuprofen 200 MG tablet Commonly known as:  ADVIL,MOTRIN   ibuprofen 800 MG tablet Commonly known as:  ADVIL,MOTRIN   naproxen 500 MG tablet Commonly known as:  NAPROSYN   traMADol 50 MG tablet Commonly known as:  ULTRAM     TAKE these medications   acetaminophen 325 MG tablet Commonly known as:  TYLENOL Take 650 mg by mouth every 6 (six) hours as needed for moderate pain.   aspirin 81 MG chewable tablet Chew 1 tablet (81 mg total) by mouth 2 (two) times daily.   diazepam 5 MG tablet Commonly known as:  VALIUM Take 1 tablet (5 mg total) by mouth every 8 (eight) hours as needed for muscle spasms.   MELATONIN PO Take 1 tablet  by mouth at bedtime.   oxyCODONE 5 MG immediate release tablet Commonly known as:  Oxy IR/ROXICODONE Take 1-2 tablets (5-10 mg total) by mouth every 4 (four) hours as needed for breakthrough pain.   tadalafil 5 MG tablet Commonly known as:  CIALIS Take 5 mg by mouth as needed for erectile dysfunction.   testosterone cypionate 200 MG/ML injection Commonly known as:  DEPOTESTOSTERONE CYPIONATE Inject 100 mg into the muscle every 7 (seven) days. Saturdays            Durable Medical Equipment        Start     Ordered   01/05/16 0813  For home use only DME Walker rolling  Once    Question:  Patient needs a walker to treat with the following  condition  Answer:  OA (osteoarthritis) of hip   01/05/16 0813   01/05/16 0813  For home use only DME 3 n 1  Once     01/05/16 0813   01/04/16 1048  DME Walker rolling  Once    Question:  Patient needs a walker to treat with the following condition  Answer:  Status post left hip replacement   01/04/16 1047      Diagnostic Studies: Dg Pelvis Portable  Result Date: 01/04/2016 CLINICAL DATA:  Status post total hip replacement on the left EXAM: PORTABLE PELVIS 1-2 VIEWS COMPARISON:  November 21, 2015 FINDINGS: There are total hip replacements bilaterally with prosthetic components bilaterally appearing well-seated on frontal view. No acute fracture or dislocation. There is soft tissue air on the left, an expected postoperative finding. IMPRESSION: Status post total hip replacements bilaterally with prosthetic components bilaterally appearing well-seated. No acute fracture or dislocation. Electronically Signed   By: Lowella Grip III M.D.   On: 01/04/2016 09:25   Dg C-arm 1-60 Min-no Report  Result Date: 01/04/2016 There is no Radiologist interpretation  for this exam.  Dg Hip Unilat With Pelvis 1v Left  Result Date: 01/04/2016 CLINICAL DATA:  Status post anterior left hip replacement. EXAM: DG HIP (WITH OR WITHOUT PELVIS) 3V*L* COMPARISON:  None. FINDINGS: Initial intraoperative radiograph shows left hip osteoarthritis. A new bipolar right hip prosthesis is seen. Second intraoperative radiograph shows placement of left acetabular cup in appropriate position. Third intraoperative radiograph shows bipolar left hip prosthesis in expected position. No fracture or dislocation identified . IMPRESSION: Placement of bipolar left hip prosthesis in expected position. No evidence of fracture or dislocation. Electronically Signed   By: Earle Gell M.D.   On: 01/04/2016 08:49    Disposition: 01-Home or Self Care  Discharge Instructions    Call MD / Call 911    Complete by:  As directed    If you  experience chest pain or shortness of breath, CALL 911 and be transported to the hospital emergency room.  If you develope a fever above 101 F, pus (white drainage) or increased drainage or redness at the wound, or calf pain, call your surgeon's office.   Constipation Prevention    Complete by:  As directed    Drink plenty of fluids.  Prune juice may be helpful.  You may use a stool softener, such as Colace (over the counter) 100 mg twice a day.  Use MiraLax (over the counter) for constipation as needed.   Diet - low sodium heart healthy    Complete by:  As directed    Discharge patient    Complete by:  As directed    Increase activity slowly as  tolerated    Complete by:  As directed       [place] Follow up.   Why:  rolling walker and 3n1 Contact information: [place] [address] [phone]        Mcarthur Rossetti, MD Follow up in 2 week(s).   Specialty:  Orthopedic Surgery Contact information: [place] [address] [phone]            Signed: Mcarthur Rossetti 01/06/2016, 1:05 PM

## 2016-01-06 NOTE — Progress Notes (Signed)
Physical Therapy Treatment Patient Details Name: Noah Gonzalez MRN: [id number] DOB: 1970-09-19 Today's Date: 01/18/16    History of Present Illness Pt s/p L THR and with hx of R THR 1/15    PT Comments    Pt limited this am by fatigue after rough night with muscle spasms - under better control now.  Pt states he has been mobilizing to/from bathroom without assist - OOB deferred and therex performed with home therex program provided.  Pt states he is comfortable with ability to function at home and to negotiate stairs into home.  Follow Up Recommendations  No PT follow up     Equipment Recommendations  Rolling walker with 5" wheels    Recommendations for Other Services OT consult     Precautions / Restrictions Precautions Precautions: Fall Restrictions Weight Bearing Restrictions: No Other Position/Activity Restrictions: WBAT    Mobility  Bed Mobility                  Transfers                    Ambulation/Gait                 Stairs            Wheelchair Mobility    Modified Rankin (Stroke Patients Only)       Balance                                    Cognition Arousal/Alertness: Awake/alert Behavior During Therapy: WFL for tasks assessed/performed Overall Cognitive Status: Within Functional Limits for tasks assessed                      Exercises Total Joint Exercises Ankle Circles/Pumps: AROM;Both;15 reps;Supine Quad Sets: AROM;Both;10 reps;Supine Heel Slides: AAROM;Left;20 reps;10 reps;Supine Hip ABduction/ADduction: AAROM;Left;20 reps;Supine    General Comments        Pertinent Vitals/Pain Pain Assessment: 0-10 Pain Score: 5  Pain Location: L hip/thigh Pain Descriptors / Indicators: Aching;Sore;Tightness Pain Intervention(s): Limited activity within patient's tolerance;Monitored during session;Premedicated before session;Ice applied    Home Living                       Prior Function            PT Goals (current goals can now be found in the care plan section) Acute Rehab PT Goals Patient Stated Goal: Regain IND and get back to the gym PT Goal Formulation: With patient Time For Goal Achievement: 01/08/16 Potential to Achieve Goals: Good Progress towards PT goals: Progressing toward goals    Frequency    7X/week      PT Plan Current plan remains appropriate    Co-evaluation             End of Session Equipment Utilized During Treatment: Gait belt Activity Tolerance: Patient tolerated treatment well Patient left: in bed;with call bell/phone within reach     Time: 0940-1020 PT Time Calculation (min) (ACUTE ONLY): 40 min  Charges:  $Therapeutic Exercise: 8-22 mins                    G Codes:      Bren Steers 01/18/2016, 12:05 PM

## 2016-01-08 ENCOUNTER — Encounter (HOSPITAL_COMMUNITY): Payer: Self-pay | Admitting: Orthopaedic Surgery

## 2016-01-08 LAB — TYPE AND SCREEN
ABO/RH(D): O POS
ANTIBODY SCREEN: NEGATIVE

## 2016-01-09 ENCOUNTER — Telehealth (INDEPENDENT_AMBULATORY_CARE_PROVIDER_SITE_OTHER): Payer: Self-pay | Admitting: Orthopaedic Surgery

## 2016-01-09 NOTE — Telephone Encounter (Signed)
[facility] is calling to let us know they're opening the case today for pt.  [phone]

## 2016-01-10 ENCOUNTER — Telehealth (INDEPENDENT_AMBULATORY_CARE_PROVIDER_SITE_OTHER): Payer: Self-pay | Admitting: Orthopaedic Surgery

## 2016-01-10 MED ORDER — HYDROCODONE-ACETAMINOPHEN 5-325 MG PO TABS: 1.0000 | ORAL_TABLET | Freq: Four times a day (QID) | ORAL | 0 refills | Status: DC | PRN

## 2016-01-10 NOTE — Telephone Encounter (Signed)
Please advise 

## 2016-01-10 NOTE — Telephone Encounter (Signed)
Pt wife stated pt is having strong headaches, she thinks its from the oxycodone hes taking post surgery. Pt wife wants to know what else pt can take. Pt wife  Almyra [phone]

## 2016-01-10 NOTE — Telephone Encounter (Signed)
noted 

## 2016-01-10 NOTE — Telephone Encounter (Signed)
Can come and pick up Norco to try.

## 2016-01-10 NOTE — Telephone Encounter (Signed)
LM with wife Rx ready at front desk

## 2016-01-11 ENCOUNTER — Telehealth (INDEPENDENT_AMBULATORY_CARE_PROVIDER_SITE_OTHER): Payer: Self-pay | Admitting: Orthopaedic Surgery

## 2016-01-11 NOTE — Telephone Encounter (Signed)
Norco #20

## 2016-01-11 NOTE — Telephone Encounter (Signed)
Pt called stating he has a migraine level headache, and is scared to take anything for his hip pain because of this. Pt is requesting call back about what medication is here because he isnt sure what norco is

## 2016-01-11 NOTE — Telephone Encounter (Signed)
Please advise for Noah Gonzalez since he's in surgery

## 2016-01-11 NOTE — Telephone Encounter (Signed)
Tramadol #20

## 2016-01-11 NOTE — Telephone Encounter (Signed)
[facility] requesting verbal on home Physical Therapy 2x a week for 1 week and 3x a week for 2 weeks CB for verbal [phone]

## 2016-01-11 NOTE — Telephone Encounter (Signed)
Called into pharmacy

## 2016-01-11 NOTE — Telephone Encounter (Signed)
ERROR

## 2016-01-11 NOTE — Telephone Encounter (Signed)
Verbal order left on VM  

## 2016-01-11 NOTE — Telephone Encounter (Signed)
Can we do something I can call in>

## 2016-01-17 ENCOUNTER — Ambulatory Visit (INDEPENDENT_AMBULATORY_CARE_PROVIDER_SITE_OTHER): Payer: 59 | Admitting: Physician Assistant

## 2016-01-17 DIAGNOSIS — Z96642 Presence of left artificial hip joint: Secondary | ICD-10-CM

## 2016-01-17 MED ORDER — TRAMADOL HCL 50 MG PO TABS: 50.0000 mg | ORAL_TABLET | Freq: Four times a day (QID) | ORAL | 0 refills | Status: DC | PRN

## 2016-01-17 MED ORDER — IBUPROFEN 800 MG PO TABS: 800.0000 mg | ORAL_TABLET | Freq: Three times a day (TID) | ORAL | 0 refills | Status: DC | PRN

## 2016-01-17 NOTE — Progress Notes (Signed)
   Post-Op Visit Note   Patient: Noah Gonzalez           Date of Birth: January 28, 1970           MRN: [id number] Visit Date: 01/17/2016 PCP: Welford Roche, NP   Assessment & Plan:  Chief Complaint: No chief complaint on file.  Visit Diagnoses:  1. Status post total replacement of left hip    Exam: Good range of motion of the left hip. Ambulates without any assistive device. Dorsiflexion plantar flexion ankle intact. Calf supple nontender. Left dorsal pedal pulse present. Surgical incision healing well no signs of infection. Plan: Work on range of motion strengthening the hip. Scar tissue mobilization. Follow-up in one month  Follow-Up Instructions: Return in about 4 weeks (around 02/14/2016) for post op.   Orders:  No orders of the defined types were placed in this encounter.  Meds ordered this encounter  Medications  . ibuprofen (ADVIL,MOTRIN) 800 MG tablet    Sig: Take 1 tablet (800 mg total) by mouth every 8 (eight) hours as needed.    Dispense:  90 tablet    Refill:  0  . traMADol (ULTRAM) 50 MG tablet    Sig: Take 1 tablet (50 mg total) by mouth every 6 (six) hours as needed.    Dispense:  60 tablet    Refill:  0     PMFS History: Patient Active Problem List   Diagnosis Date Noted  . Unilateral primary osteoarthritis, left hip 01/04/2016  . Status post left hip replacement 01/04/2016  . Arthritis pain of right hip 01/21/2013  . Status post THR (total hip replacement) 01/21/2013   Past Medical History:  Diagnosis Date  . Arthritis    osteoarthritis-hips  . GERD (gastroesophageal reflux disease)    12/26/15- states has resolved  . Hypertension    history of- has lost 40 lbs  . Low serum testosterone level    recent tx. done    No family history on file.  Past Surgical History:  Procedure Laterality Date  . TOTAL HIP ARTHROPLASTY Right 01/21/2013   Procedure: RIGHT TOTAL HIP ARTHROPLASTY ANTERIOR APPROACH;  Surgeon: Mcarthur Rossetti, MD;  Location: [facility];  Service: Orthopedics;  Laterality: Right;  . TOTAL HIP ARTHROPLASTY Left 01/04/2016   Procedure: LEFT TOTAL HIP ARTHROPLASTY ANTERIOR APPROACH;  Surgeon: Mcarthur Rossetti, MD;  Location: [facility];  Service: Orthopedics;  Laterality: Left;  . WISDOM TOOTH EXTRACTION     Social History   Occupational History  . Not on file.   Social History Main Topics  . Smoking status: Former Smoker    Years: 2.00    Types: Cigarettes    Quit date: 01/17/2005  . Smokeless tobacco: Former Systems developer    Types: Chew     Comment: quit 6 yrs ago  . Alcohol use No  . Drug use: No  . Sexual activity: Yes

## 2016-02-11 ENCOUNTER — Telehealth (INDEPENDENT_AMBULATORY_CARE_PROVIDER_SITE_OTHER): Payer: Self-pay | Admitting: *Deleted

## 2016-02-11 ENCOUNTER — Encounter (INDEPENDENT_AMBULATORY_CARE_PROVIDER_SITE_OTHER): Payer: Self-pay

## 2016-02-11 NOTE — Telephone Encounter (Signed)
LMOM for patient letting him know he can call back and leave message on what was wrong with the note, and if he will pickup or need me to fax when completed

## 2016-02-11 NOTE — Telephone Encounter (Signed)
Patient came in this morning regarding a problem with his work release date? His CB # [phone]. Thank you

## 2016-02-19 ENCOUNTER — Ambulatory Visit (INDEPENDENT_AMBULATORY_CARE_PROVIDER_SITE_OTHER): Payer: 59 | Admitting: Physician Assistant

## 2016-02-19 DIAGNOSIS — Z96642 Presence of left artificial hip joint: Secondary | ICD-10-CM

## 2016-02-19 NOTE — Progress Notes (Signed)
   Office Visit Note   Patient: Noah Gonzalez           Date of Birth: [birth year]           MRN: [id number] Visit Date: 02/19/2016              Requested by: Welford Roche, NP [place] [place], [place] [address] PCP: Welford Roche, NP   Assessment & Plan: Visit Diagnoses:  1. Status post total replacement of left hip     Plan: He will continue to work on range of motion strengthening of the left hip. See him back in 1 year postop unless he has any questions or concerns. He pelvis and lateral view of the left hip at that time  Follow-Up Instructions: Return in about 9 months (around 11/18/2016) for Radiographs.   Orders:  No orders of the defined types were placed in this encounter.  No orders of the defined types were placed in this encounter.     Procedures: No procedures performed   Clinical Data: No additional findings.   Subjective: No chief complaint on file.   HPI Noah Gonzalez overall is doing very well and has no concerns. States that his hip incision is doing very well. Is back to riding spin bike. Review of Systems   Objective: Vital Signs: There were no vitals taken for this visit.  Physical Exam  Ortho Exam Left hip good range of motion without pain today. He ambulates without any assistive devices and without an antalgic gait. Specialty Comments:  No specialty comments available.  Imaging: No results found.   PMFS History: Patient Active Problem List   Diagnosis Date Noted  . Unilateral primary osteoarthritis, left hip 01/04/2016  . Status post left hip replacement 01/04/2016  . Arthritis pain of right hip 01/21/2013  . Status post THR (total hip replacement) 01/21/2013   Past Medical History:  Diagnosis Date  . Arthritis    osteoarthritis-hips  . GERD (gastroesophageal reflux disease)    12/26/15- states has resolved  . Hypertension    history of- has lost 40 lbs  . Low serum testosterone level    recent tx. done    No family history on file.  Past Surgical History:  Procedure Laterality Date  . TOTAL HIP ARTHROPLASTY Right 01/21/2013   Procedure: RIGHT TOTAL HIP ARTHROPLASTY ANTERIOR APPROACH;  Surgeon: Mcarthur Rossetti, MD;  Location: [facility];  Service: Orthopedics;  Laterality: Right;  . TOTAL HIP ARTHROPLASTY Left 01/04/2016   Procedure: LEFT TOTAL HIP ARTHROPLASTY ANTERIOR APPROACH;  Surgeon: Mcarthur Rossetti, MD;  Location: [facility];  Service: Orthopedics;  Laterality: Left;  . WISDOM TOOTH EXTRACTION     Social History   Occupational History  . Not on file.   Social History Main Topics  . Smoking status: Former Smoker    Years: 2.00    Types: Cigarettes    Quit date: 01/17/2005  . Smokeless tobacco: Former Systems developer    Types: Chew     Comment: quit 6 yrs ago  . Alcohol use No  . Drug use: No  . Sexual activity: Yes

## 2016-11-18 ENCOUNTER — Ambulatory Visit (INDEPENDENT_AMBULATORY_CARE_PROVIDER_SITE_OTHER): Payer: Self-pay | Admitting: Orthopaedic Surgery

## 2016-11-18 ENCOUNTER — Ambulatory Visit (INDEPENDENT_AMBULATORY_CARE_PROVIDER_SITE_OTHER): Payer: Self-pay

## 2016-11-18 ENCOUNTER — Encounter (INDEPENDENT_AMBULATORY_CARE_PROVIDER_SITE_OTHER): Payer: Self-pay | Admitting: Orthopaedic Surgery

## 2016-11-18 DIAGNOSIS — Z96642 Presence of left artificial hip joint: Secondary | ICD-10-CM | POA: Insufficient documentation

## 2016-11-18 DIAGNOSIS — Z96641 Presence of right artificial hip joint: Secondary | ICD-10-CM

## 2016-11-18 NOTE — Progress Notes (Signed)
The patient is now 10 months out from a left total hip arthroplasty through direct anterior approach and 4 years out from a right total hip arthroplasty.  This was due to severe degenerative joint disease and femoral acetabular impingement bilaterally.  He says he is doing great and is lost weight.  He said this helped his back he has excellent mobility and has no issues at all.  On exam his range of motion is full of both hips with no problems at all and no pain.   His leg lengths are equal  X-rays of his pelvis and left hip show well-seated implants bilaterally no comp gating features.  The leg lengths and offset are equal as well.  There is no evidence of loosening.  At this point will follow-up as needed.  We talked at length about the things we need to bring him back for his hips.  All questions and concerns were answered and addressed.  He has been seen for anything else as well.

## 2017-04-17 DIAGNOSIS — N62 Hypertrophy of breast: Secondary | ICD-10-CM | POA: Diagnosis not present

## 2017-04-17 DIAGNOSIS — E291 Testicular hypofunction: Secondary | ICD-10-CM | POA: Diagnosis not present

## 2017-04-23 DIAGNOSIS — E669 Obesity, unspecified: Secondary | ICD-10-CM | POA: Diagnosis not present

## 2017-04-23 DIAGNOSIS — I10 Essential (primary) hypertension: Secondary | ICD-10-CM | POA: Diagnosis not present

## 2017-04-23 DIAGNOSIS — R972 Elevated prostate specific antigen [PSA]: Secondary | ICD-10-CM | POA: Diagnosis not present

## 2017-04-23 DIAGNOSIS — E291 Testicular hypofunction: Secondary | ICD-10-CM | POA: Diagnosis not present

## 2017-05-05 DIAGNOSIS — F4325 Adjustment disorder with mixed disturbance of emotions and conduct: Secondary | ICD-10-CM | POA: Diagnosis not present

## 2017-06-12 DIAGNOSIS — F4325 Adjustment disorder with mixed disturbance of emotions and conduct: Secondary | ICD-10-CM | POA: Diagnosis not present

## 2017-06-17 ENCOUNTER — Ambulatory Visit: Payer: Self-pay | Admitting: *Deleted

## 2017-06-17 NOTE — Telephone Encounter (Signed)
Called in c/o having palpitations on Sunday while mountain biking.   No chest pain with the palpitations.   Has some shortness of breath when it occurs.   Denies having chest pain, palpitations or shortness of breath right now.  He had palpitations similar to this 10 years ago but has never had it evaluated.    See triage notes below.  He is a nurse.   He was referred to see Dr. Parker by Dr. Rigby. The agent made him an appt with Dr. Parker to be established and to have the palpitations evaluated on 06/19/17 at 10:20am. I instructed the pt to go to the ED if he should experience the symptoms again, have chest pain or shortness of breath prior to his appt on Friday.    Her verbalized understanding and was agreeable to this plan.   Reason for Disposition . Palpitations are a chronic symptom (recurrent or ongoing AND present > 4 weeks)    Not having any distress or palpitations now.   This happened on Sunday while mountain biking.  Answer Assessment - Initial Assessment Questions 1. DESCRIPTION: "Please describe your heart rate or heart beat that you are having" (e.g., fast/slow, regular/irregular, skipped or extra beats, "palpitations")     I had palpitations on Sunday while mountain biking.   2. ONSET: "When did it start?" (Minutes, hours or days)      Sunday.    I was tired.   I\'d been busy moving a lot of dirt the day before. 3. DURATION: "How long does it last" (e.g., seconds, minutes, hours)     I could feel when my heart rate goes up. 4. PATTERN "Does it come and go, or has it been constant since it started?"  "Does it get worse with exertion?"   "Are you feeling it now?"     They come and go as far as exertion.   Yesterday I walked around for an hour and felt better.    5. TAP: "Using your hand, can you tap out what you are feeling on a chair or table in front of you, so that I can hear?" (Note: not all patients can do this)       *No Answer* 6. HEART RATE: "Can you tell me your heart  rate?" "How many beats in 15 seconds?"  (Note: not all patients can do this)       *No Answer* 7. RECURRENT SYMPTOM: "Have you ever had this before?" If so, ask: "When was the last time?" and "What happened that time?"      10  years ago I had this evaluated.    No chest pain or shortness of breath now.    I get short of breath when working out. 8. CAUSE: "What do you think is causing the palpitations?"     Exertion 9. CARDIAC HISTORY: "Do you have any history of heart disease?" (e.g., heart attack, angina, bypass surgery, angioplasty, arrhythmia)      No 10. OTHER SYMPTOMS: "Do you have any other symptoms?" (e.g., dizziness, chest pain, sweating, difficulty breathing)       None of the above. 11. PREGNANCY: "Is there any chance you are pregnant?" "When was your last menstrual period?"       N/A  Protocols used: [place]

## 2017-06-19 ENCOUNTER — Encounter: Payer: Self-pay | Admitting: Family Medicine

## 2017-06-19 ENCOUNTER — Ambulatory Visit (INDEPENDENT_AMBULATORY_CARE_PROVIDER_SITE_OTHER): Payer: BLUE CROSS/BLUE SHIELD | Admitting: Family Medicine

## 2017-06-19 VITALS — BP 128/78 | HR 61 | Temp 98.6°F | Ht 69.0 in | Wt 227.0 lb

## 2017-06-19 DIAGNOSIS — E291 Testicular hypofunction: Secondary | ICD-10-CM | POA: Diagnosis not present

## 2017-06-19 DIAGNOSIS — R002 Palpitations: Secondary | ICD-10-CM | POA: Diagnosis not present

## 2017-06-19 DIAGNOSIS — I493 Ventricular premature depolarization: Secondary | ICD-10-CM

## 2017-06-19 DIAGNOSIS — I10 Essential (primary) hypertension: Secondary | ICD-10-CM

## 2017-06-19 DIAGNOSIS — Z8042 Family history of malignant neoplasm of prostate: Secondary | ICD-10-CM | POA: Insufficient documentation

## 2017-06-19 NOTE — Assessment & Plan Note (Addendum)
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  He does not have any red flag signs or symptoms.  His EKG today is within normal limits.  Given that he had only one episode almost a week ago without any other symptoms, do not need further work-up at this point.  We will continue with watchful waiting.  Discussed reasons to return to care including development of chest pain, shortness of breath, dyspnea on exertion, decreased exercise capacity, dizziness, syncope, or frequency.  If any of these occur, will need Holter monitoring and possibly echocardiogram.  Discussed importance of caffeine avoidance and good oral hydration.

## 2017-06-19 NOTE — Assessment & Plan Note (Signed)
At goal.  Continue losartan 50 mg daily. 

## 2017-06-19 NOTE — Patient Instructions (Signed)
It was very nice to see you today!  I think you are probably having PVCs. These are usually benign premature beats that do not cause or indicate any serious problems.  Please make sure you are staying well-hydrated.  If you start to have any sort of chest pain, shortness of breath, dizziness, passing out, or decreased exercise capacity, please let me know.  If your palpitations become more frequent or if you notice any specific triggers, please let me know.  Please come back in 3 months for your annual physical, or sooner as needed.  Take care, Dr Jerline Pain

## 2017-06-19 NOTE — Progress Notes (Signed)
Subjective:  Noah Gonzalez is a 47 y.o. male who presents today with a chief complaint of palpitations and to establish care.   HPI:  Palpitations, chronic problem, new to provider Patient has had intermittent palpitations for the past several years.  He had went several years without palpitations daily had a recent episode about 5 days ago.  Patient had completed a long bike ride when he felt like his heart was "flipping".  Afterwards had about 30 seconds of his heart racing and then symptoms subsided.  He had a little bit of "congestion" for the next several days, but today is basically back to normal.  He has not had any recurrent episodes of palpitations.  He denies any increased caffeine intake.  He drinks.  A little bit dehydrated.  No chest pain.  No shortness of breath.  No decreased exercise capacity.  No dizziness or syncope.  No other clear precipitating events.  No other obvious alleviating or aggravating factors.  He has had episodes in the past that he related to increased caffeine intake.  Hypertension, chronic problem, new to provider 3-year history.  On losartan 50 mg daily which he tolerates well.  Low testosterone, chronic problem, new to provider Follows with endocrinology for this.  He is currently on testosterone replacement.  ROS: Per HPI, otherwise a complete review of systems was negative.   PMH:  The following were reviewed and entered/updated in epic: Past Medical History:  Diagnosis Date  . Arthritis    osteoarthritis-hips  . GERD (gastroesophageal reflux disease)    12/26/15- states has resolved  . Hypertension    history of- has lost 40 lbs  . Low serum testosterone level    recent tx. done   Patient Active Problem List   Diagnosis Date Noted  . Palpitations 06/19/2017  . Family history of prostate cancer 06/19/2017  . History of bilateral hip replacements 01/04/2016  . Elevated PSA, less than 10 ng/ml 10/18/2015  . Gynecomastia 02/02/2015  .  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 Essential hypertension 11/03/2014  . Male hypogonadism 11/03/2014  . Obesity (BMI 30.0-34.9) 11/03/2014   Past Surgical History:  Procedure Laterality Date  . TOTAL HIP ARTHROPLASTY Right 01/21/2013   Procedure: RIGHT TOTAL HIP ARTHROPLASTY ANTERIOR APPROACH;  Surgeon: Mcarthur Rossetti, MD;  Location: [facility];  Service: Orthopedics;  Laterality: Right;  . TOTAL HIP ARTHROPLASTY Left 01/04/2016   Procedure: LEFT TOTAL HIP ARTHROPLASTY ANTERIOR APPROACH;  Surgeon: Mcarthur Rossetti, MD;  Location: [facility];  Service: Orthopedics;  Laterality: Left;  . WISDOM TOOTH EXTRACTION      Family History  Problem Relation Age of Onset  . Pancreatic cancer Mother   . Lung cancer Maternal Grandfather   . Prostate cancer Paternal Grandfather     Medications- reviewed and updated Current Outpatient Medications  Medication Sig Dispense Refill  . losartan (COZAAR) 50 MG tablet Take by mouth.    . Melatonin 10 MG CAPS Take by mouth.    . testosterone cypionate (DEPOTESTOTERONE CYPIONATE) 200 MG/ML injection Inject 100 mg into the muscle every 7 (seven) days. Saturdays    . acetaminophen (TYLENOL) 325 MG tablet Take 650 mg by mouth every 6 (six) hours as needed for moderate pain.    . tadalafil (CIALIS) 5 MG tablet Take 5 mg by mouth as needed for erectile dysfunction.      No current facility-administered medications for this visit.     Allergies-reviewed and updated Allergies  Allergen Reactions  .  Hydrocodone Nausea And Vomiting    Headache   . Oxycodone Hcl Other (See Comments)    Causes severe headache    Social History   Socioeconomic History  . Marital status: Married    Spouse name: Not on file  . Number of children: 0  . Years of education: Not on file  . Highest education level: Not on file  Occupational History    Comment: [employer]  . Financial resource strain: Not on file  . Food insecurity:      Worry: Not on file    Inability: Not on file  . Transportation needs:    Medical: Not on file    Non-medical: Not on file  Tobacco Use  . Smoking status: Former Smoker    Years: 2.00    Types: Cigarettes    Last attempt to quit: 01/17/2005    Years since quitting: 12.4  . Smokeless tobacco: Former Systems developer    Types: Chew  . Tobacco comment: quit 6 yrs ago  Substance and Sexual Activity  . Alcohol use: No  . Drug use: No  . Sexual activity: Yes  Lifestyle  . Physical activity:    Days per week: Not on file    Minutes per session: Not on file  . Stress: Not on file  Relationships  . Social connections:    Talks on phone: Not on file    Gets together: Not on file    Attends religious service: Not on file    Active member of club or organization: Not on file    Attends meetings of clubs or organizations: Not on file    Relationship status: Not on file  Other Topics Concern  . Not on file  Social History Narrative  . Not on file   Objective:  Physical Exam: BP 128/78 (BP Location: Left Arm, Patient Position: Sitting, Cuff Size: Normal)   Pulse 61   Temp 98.6 F (37 C) (Oral)   Ht 5\' 9"  (1.753 m)   Wt 227 lb (103 kg)   SpO2 96%   BMI 33.52 kg/m   Gen: NAD, resting comfortably CV: RRR with no murmurs appreciated Pulm: NWOB, CTAB with no crackles, wheezes, or rhonchi GI: Normal bowel sounds present. Soft, Nontender, Nondistended. MSK: No edema, cyanosis, or clubbing noted Skin: Warm, dry Neuro: Grossly normal, moves all extremities Psych: Normal affect and thought content  EKG: Sinus bradycardia.  No PVCs or PACs.  No signs of LVH.  Assessment/Plan:  Palpitations Likely secondary to PVCs.  He does not have any red flag signs or symptoms.  His EKG today is within normal limits.  Given that he had only one episode almost a week ago without any other symptoms, do not need further work-up at this point.  We will continue with watchful waiting.  Discussed reasons to  return to care including development of chest pain, shortness of breath, dyspnea on exertion, decreased exercise capacity, dizziness, syncope, or frequency.  If any of these occur, will need Holter monitoring and possibly echocardiogram.  Discussed importance of caffeine avoidance and good oral hydration.  Essential hypertension At goal.  Continue losartan 50 mg daily.  Male hypogonadism Currently being managed by his endocrinologist.  Preventative healthcare Patient will return soon for CPE with blood work.  Algis Greenhouse. Jerline Pain, MD 06/19/2017 11:48 AM

## 2017-06-19 NOTE — Assessment & Plan Note (Signed)
Currently being managed by his endocrinologist.

## 2017-06-22 DIAGNOSIS — F4325 Adjustment disorder with mixed disturbance of emotions and conduct: Secondary | ICD-10-CM | POA: Diagnosis not present

## 2017-09-18 ENCOUNTER — Encounter: Payer: BLUE CROSS/BLUE SHIELD | Admitting: Family Medicine

## 2017-10-27 ENCOUNTER — Encounter: Payer: Self-pay | Admitting: Family Medicine

## 2017-10-27 DIAGNOSIS — Z0289 Encounter for other administrative examinations: Secondary | ICD-10-CM

## 2017-11-02 ENCOUNTER — Encounter: Payer: Self-pay | Admitting: Family Medicine

## 2017-11-13 DIAGNOSIS — C801 Malignant (primary) neoplasm, unspecified: Secondary | ICD-10-CM

## 2017-11-13 HISTORY — DX: Malignant (primary) neoplasm, unspecified: C80.1

## 2017-11-24 ENCOUNTER — Encounter: Payer: Self-pay | Admitting: Family Medicine

## 2017-11-24 ENCOUNTER — Ambulatory Visit (INDEPENDENT_AMBULATORY_CARE_PROVIDER_SITE_OTHER): Payer: No Typology Code available for payment source | Admitting: Family Medicine

## 2017-11-24 VITALS — BP 134/82 | HR 63 | Temp 98.5°F | Ht 69.0 in | Wt 222.4 lb

## 2017-11-24 DIAGNOSIS — F419 Anxiety disorder, unspecified: Secondary | ICD-10-CM

## 2017-11-24 DIAGNOSIS — N529 Male erectile dysfunction, unspecified: Secondary | ICD-10-CM

## 2017-11-24 DIAGNOSIS — F331 Major depressive disorder, recurrent, moderate: Secondary | ICD-10-CM | POA: Diagnosis not present

## 2017-11-24 DIAGNOSIS — I10 Essential (primary) hypertension: Secondary | ICD-10-CM | POA: Diagnosis not present

## 2017-11-24 MED ORDER — TADALAFIL 10 MG PO TABS: 10.0000 mg | ORAL_TABLET | ORAL | 3 refills | Status: DC | PRN

## 2017-11-24 MED ORDER — SERTRALINE HCL 50 MG PO TABS: ORAL_TABLET | ORAL | 1 refills | Status: DC

## 2017-11-24 MED ORDER — LOSARTAN POTASSIUM 50 MG PO TABS: 50.0000 mg | ORAL_TABLET | Freq: Every day | ORAL | 3 refills | Status: DC

## 2017-11-24 MED ORDER — DIAZEPAM 5 MG PO TABS: 5.0000 mg | ORAL_TABLET | Freq: Two times a day (BID) | ORAL | 1 refills | Status: DC | PRN

## 2017-11-24 NOTE — Progress Notes (Signed)
   Subjective:  Noah Gonzalez is a 47 y.o. male who presents today with a chief complaint of depression/anxiety.   HPI:  Depression/Anxiety, chronic problems, new to provider Patient with several year history of both depression and anxiety.  He has been diagnosed with OCD in the past.  He has tried several medications in the past including Lexapro, Prozac, Wellbutrin, and Xanax.  Does not remember if any these medications were particularly effective.  He has been under quite a bit of stress recently at home and at work.  He has been seeing a therapist and trying home meditation both which help.  Depression screen PHQ 2/9 11/24/2017  Decreased Interest 2  Down, Depressed, Hopeless 2  PHQ - 2 Score 4  Altered sleeping 3  Tired, decreased energy 2  Change in appetite 3  Feeling bad or failure about yourself  3  Trouble concentrating 3  Moving slowly or fidgety/restless 3  Suicidal thoughts 0  PHQ-9 Score 21  Difficult doing work/chores Extremely dIfficult    GAD 7 : Generalized Anxiety Score 11/24/2017  Nervous, Anxious, on Edge 3  Control/stop worrying 3  Worry too much - different things 2  Trouble relaxing 3  Restless 3  Easily annoyed or irritable 2  Afraid - awful might happen 2  Total GAD 7 Score 18  Anxiety Difficulty Very difficult    HTN, chronic problem, stable On losartan 50 mg daily and tolerating well.  ED, chronic problem, stable On Cialis 5 to 10 mg daily as needed and tolerating well.   ROS: Per HPI  PMH: He reports that he quit smoking about 12 years ago. His smoking use included cigarettes. He quit after 2.00 years of use. He has quit using smokeless tobacco.  His smokeless tobacco use included chew. He reports that he does not drink alcohol or use drugs.  Objective:  Physical Exam: BP 134/82 (BP Location: Left Arm, Patient Position: Sitting, Cuff Size: Normal)   Pulse 63   Temp 98.5 F (36.9 C) (Oral)   Ht 5\' 9"  (1.753 m)   Wt 222 lb 6.4 oz  (100.9 kg)   SpO2 98%   BMI 32.84 kg/m   Gen: NAD, resting comfortably CV: RRR with no murmurs appreciated Pulm: NWOB, CTAB with no crackles, wheezes, or rhonchi Neuro: Grossly normal, moves all extremities Psych: Normal affect and thought content  Assessment/Plan:  Vasculogenic erectile dysfunction Stable.  Refilled Cialis today.  Major depressive disorder, recurrent episode, moderate (HCC) We will start Zoloft 50 mg daily for 1 week, then increase to 100 mg daily for 1 week, then increase to 150 mg daily.  We will follow-up with me in 4 to 6 weeks.  He will continue seeing his therapist.  Discussed reasons to return to care earlier.  Essential hypertension At goal.  Continue losartan 50 mg daily.  Anxiety See depression A/P.  Start Zoloft and titrate to 150 mg daily over the next few weeks.  Given his severe anxiety and the fact that it will take some time for the Zoloft to become effective, will start Valium 5 mg daily every 12 hours as needed over the next 2 weeks.  Discussed potential long-term side effects and risks of this medication.  Follow-up in 4 to 6 weeks.  Algis Greenhouse. Jerline Pain, MD 11/24/2017 3:49 PM

## 2017-11-24 NOTE — Assessment & Plan Note (Signed)
We will start Zoloft 50 mg daily for 1 week, then increase to 100 mg daily for 1 week, then increase to 150 mg daily.  We will follow-up with me in 4 to 6 weeks.  He will continue seeing his therapist.  Discussed reasons to return to care earlier.

## 2017-11-24 NOTE — Assessment & Plan Note (Signed)
At goal.  Continue losartan 50 mg daily. 

## 2017-11-24 NOTE — Assessment & Plan Note (Signed)
Stable.  Refilled Cialis today.

## 2017-11-24 NOTE — Patient Instructions (Signed)
It was very nice to see you today!  We will start zoloft and valium today. Please take zoloft 50mg  daily for 1 week, then 100mg  daily for 1 week, then 150mg  daily.  Please use the valium as needed.  Come back to see me in 4-6 weeks, or sooner as needed.   Take care, Dr Jerline Pain

## 2017-11-24 NOTE — Assessment & Plan Note (Signed)
See depression A/P.  Start Zoloft and titrate to 150 mg daily over the next few weeks.  Given his severe anxiety and the fact that it will take some time for the Zoloft to become effective, will start Valium 5 mg daily every 12 hours as needed over the next 2 weeks.  Discussed potential long-term side effects and risks of this medication.  Follow-up in 4 to 6 weeks.

## 2017-12-09 ENCOUNTER — Telehealth: Payer: Self-pay | Admitting: Family Medicine

## 2017-12-09 NOTE — Telephone Encounter (Signed)
Wants to talk to Doctor regarding ED issues, if he might need to see a urologist. Please call back.  [facility]

## 2017-12-13 IMAGING — DX DG PORTABLE PELVIS
1 series · 1 of 1 positions shown · non-contrast
Comparison: November 21, 2015

CLINICAL DATA: Status post total hip replacement on the left

EXAM:
PORTABLE PELVIS 1-2 VIEWS

[pelvis ap]
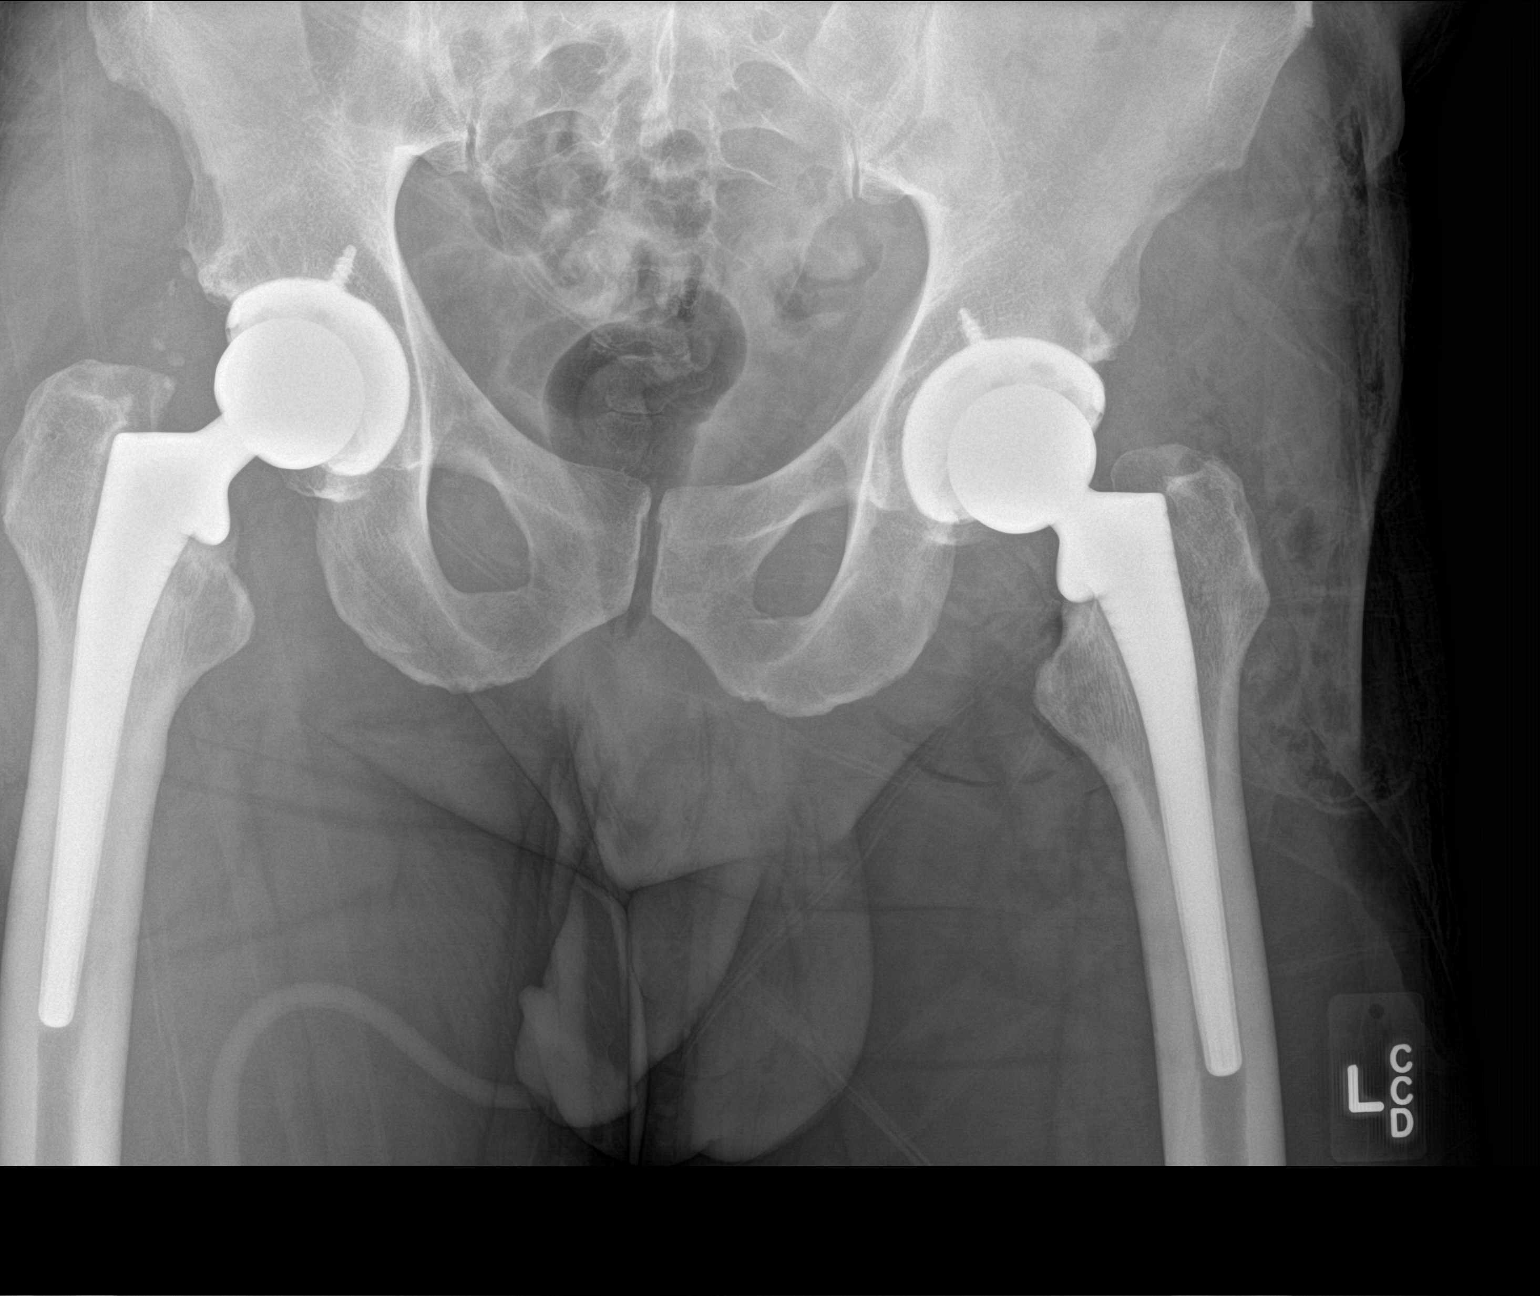

[1 of 1 positions shown; findings below may reference images not displayed]

FINDINGS: There are total hip replacements bilaterally with prosthetic
components bilaterally appearing well-seated on frontal view. No
acute fracture or dislocation. There is soft tissue air on the left,
an expected postoperative finding.
IMPRESSION: Status post total hip replacements bilaterally with prosthetic
components bilaterally appearing well-seated. No acute fracture or
dislocation.

## 2017-12-13 IMAGING — RF DG HIP (WITH OR WITHOUT PELVIS) 1V*L*
1 series · 6 of 6 positions shown · non-contrast
Comparison: None.

CLINICAL DATA: Status post anterior left hip replacement.

EXAM:
DG HIP (WITH OR WITHOUT PELVIS) 3V*L*

[Series 1: run · 6 of 6 slices shown]
[im 1/6]
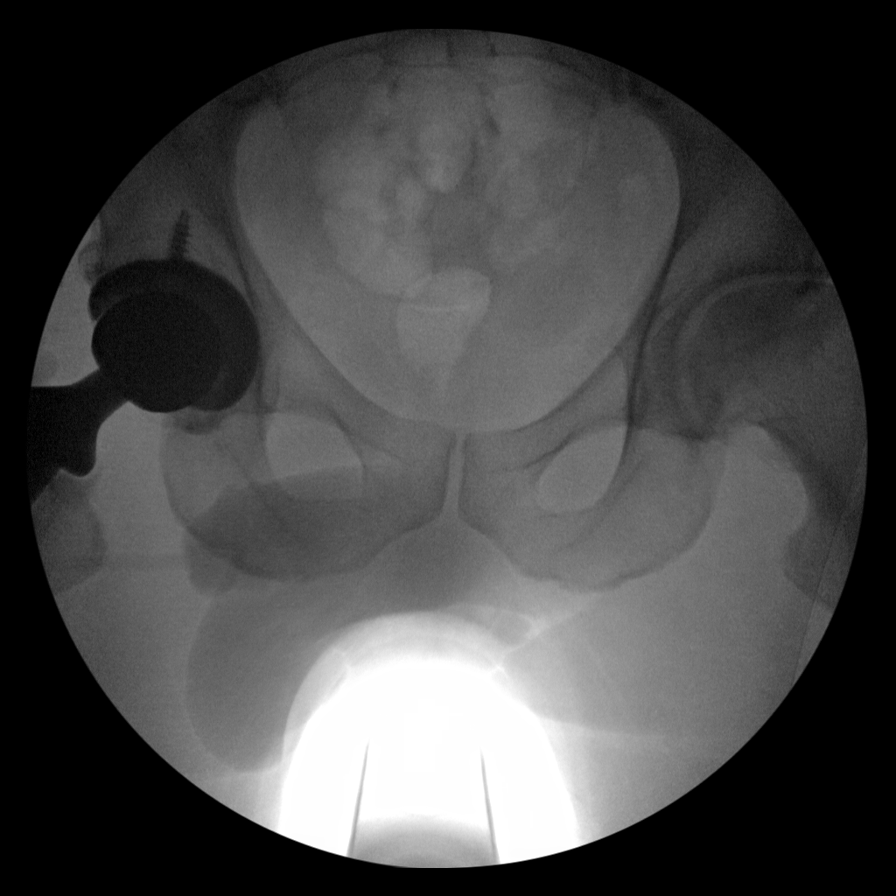
[im 2/6]
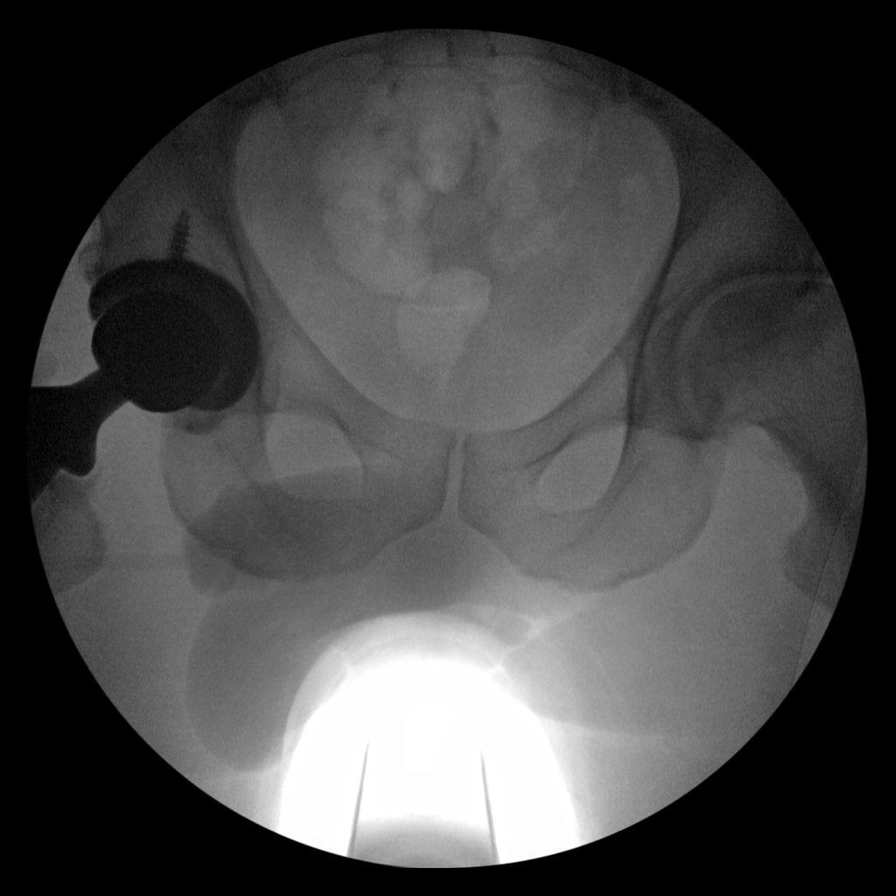
[im 3/6]
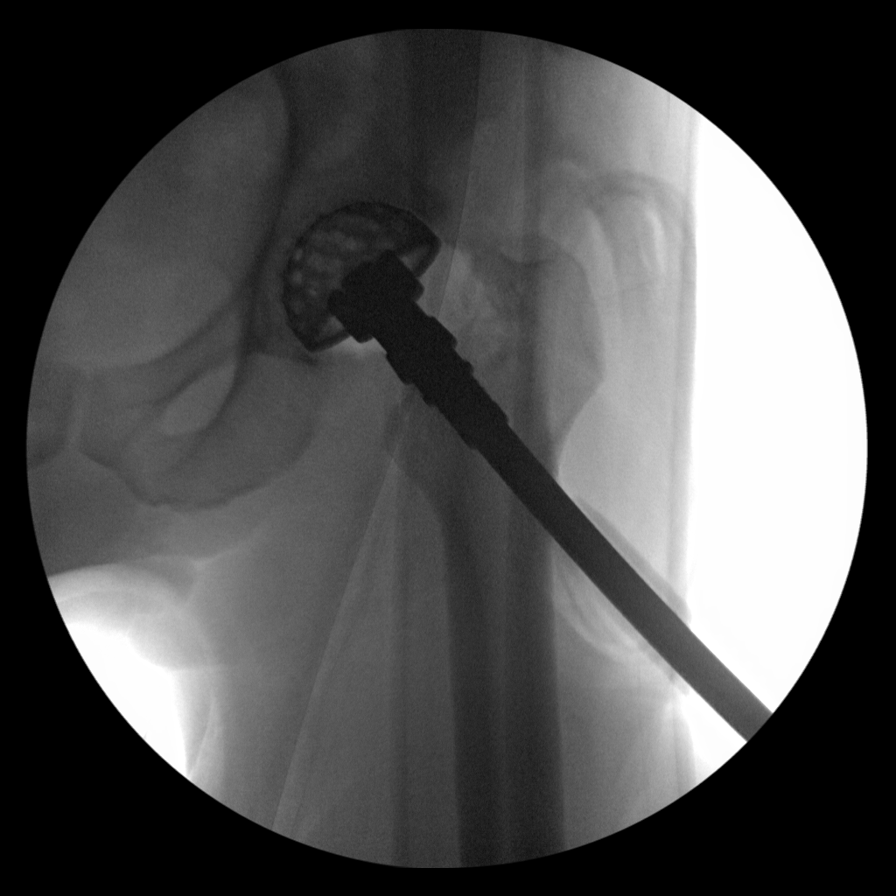
[im 4/6]
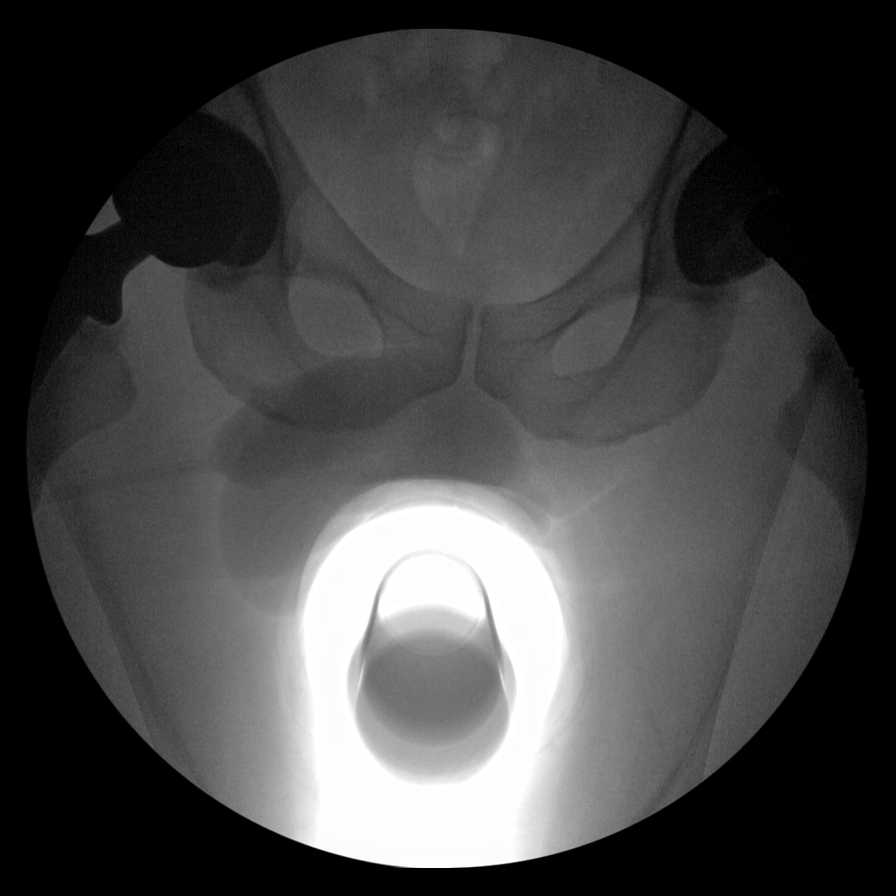
[im 5/6]
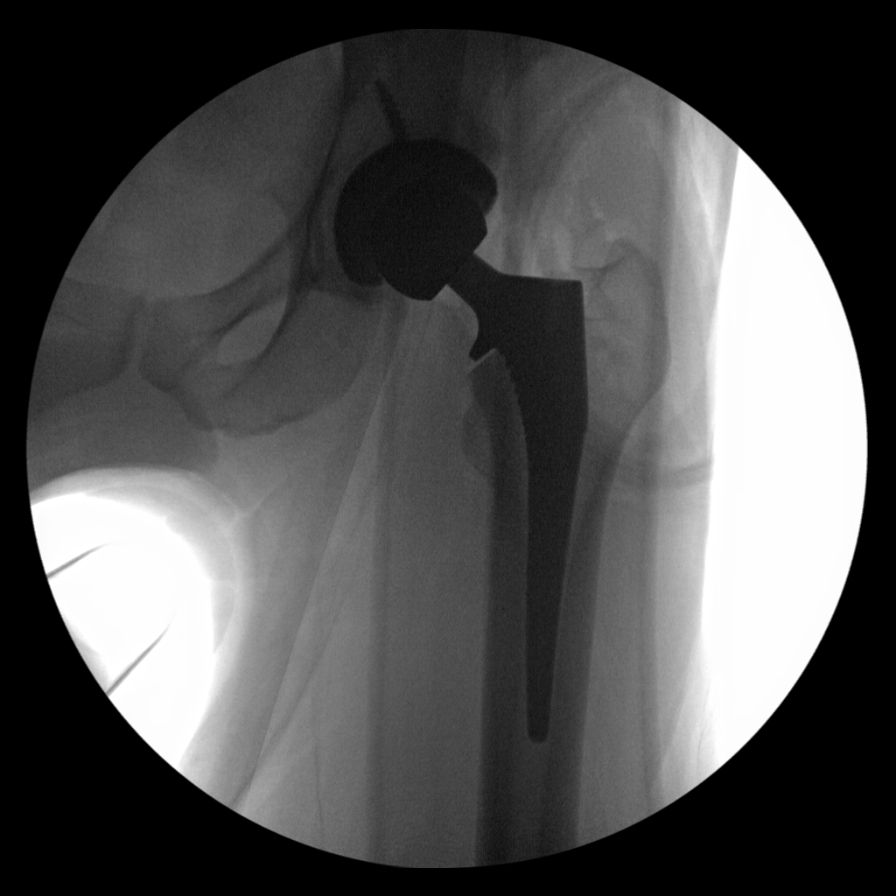
[im 6/6]
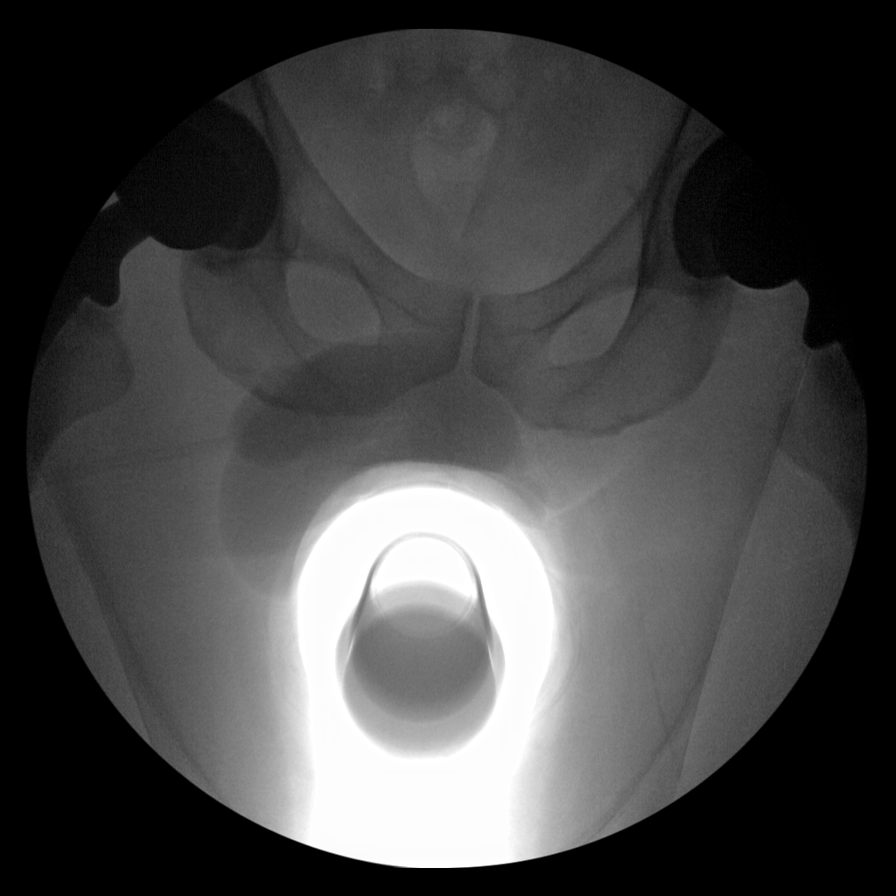

[6 of 6 positions shown; findings below may reference images not displayed]

FINDINGS: Initial intraoperative radiograph shows left hip osteoarthritis. A
new bipolar right hip prosthesis is seen.

Second intraoperative radiograph shows placement of left acetabular
cup in appropriate position.

Third intraoperative radiograph shows bipolar left hip prosthesis in
expected position. No fracture or dislocation identified .
IMPRESSION: Placement of bipolar left hip prosthesis in expected position. No
evidence of fracture or dislocation.

## 2017-12-15 NOTE — Telephone Encounter (Signed)
Patient stated that he will discuss issues at his upcoming appt.

## 2018-01-01 ENCOUNTER — Encounter: Payer: Self-pay | Admitting: Family Medicine

## 2018-01-01 ENCOUNTER — Ambulatory Visit (INDEPENDENT_AMBULATORY_CARE_PROVIDER_SITE_OTHER): Payer: No Typology Code available for payment source | Admitting: Family Medicine

## 2018-01-01 VITALS — BP 142/86 | HR 72 | Temp 97.7°F | Ht 69.0 in | Wt 228.8 lb

## 2018-01-01 DIAGNOSIS — I10 Essential (primary) hypertension: Secondary | ICD-10-CM | POA: Diagnosis not present

## 2018-01-01 DIAGNOSIS — F331 Major depressive disorder, recurrent, moderate: Secondary | ICD-10-CM

## 2018-01-01 DIAGNOSIS — F419 Anxiety disorder, unspecified: Secondary | ICD-10-CM

## 2018-01-01 DIAGNOSIS — N529 Male erectile dysfunction, unspecified: Secondary | ICD-10-CM

## 2018-01-01 MED ORDER — BUPROPION HCL ER (XL) 150 MG PO TB24: 150.0000 mg | ORAL_TABLET | Freq: Every day | ORAL | 5 refills | Status: DC

## 2018-01-01 NOTE — Progress Notes (Signed)
Subjective:  Noah Gonzalez is a 47 y.o. male who presents today with a chief complaint of depression/anxiety follow-up.   HPI:  Depression/Anxiety, established problem Last seen about 5 weeks ago for this.  He started him on Zoloft 50 mg daily and Valium 5 mg every 12 hours as needed.  He was able to increase his Zoloft to 150 mg daily however had some side effects and backed off to 100 mg daily.  He is currently at 100 mg daily and doing well.  Thinks that this dose is working well for him.  He was using Valium every 12 hours for the first 1 to 2 weeks but now is only using as needed.  Has not had to use in several days.  He has had some side effects including increasing fatigue and erectile dysfunction-see below problems.  Otherwise feels like his symptoms are well controlled.  Depression screen PHQ 2/9 01/01/2018  Decreased Interest 1  Down, Depressed, Hopeless 1  PHQ - 2 Score 2  Altered sleeping 0  Tired, decreased energy 2  Change in appetite 1  Feeling bad or failure about yourself  0  Trouble concentrating 1  Moving slowly or fidgety/restless 0  Suicidal thoughts 0  PHQ-9 Score 6  Difficult doing work/chores Not difficult at all    GAD 7 : Generalized Anxiety Score 01/01/2018  Nervous, Anxious, on Edge 1  Control/stop worrying 1  Worry too much - different things 0  Trouble relaxing 1  Restless 0  Easily annoyed or irritable 0  Afraid - awful might happen 0  Total GAD 7 Score 3  Anxiety Difficulty -    Erectile Dysfunction Symptoms have worsened a little bit since his last visit.  Thinks this is mostly due to the Zoloft.  He is taking Cialis 10 mg daily with no significant improvement.  Reports that he took some leftover Viagra 50 mg tablets which seem to work well.  Reportedly tolerated this well without side effects.  Essential hypertension On losartan 50 mg daily.  Tolerating well with no reported side effects.  ROS: Per HPI  PMH: He reports that he quit  smoking about 12 years ago. His smoking use included cigarettes. He quit after 2.00 years of use. He has quit using smokeless tobacco.  His smokeless tobacco use included chew. He reports that he does not drink alcohol or use drugs.  Objective:  Physical Exam: BP (!) 142/86 (BP Location: Left Arm, Patient Position: Sitting, Cuff Size: Normal)   Pulse 72   Temp 97.7 F (36.5 C) (Oral)   Ht 5\' 9"  (1.753 m)   Wt 228 lb 12 oz (103.8 kg)   SpO2 93%   BMI 33.78 kg/m   Gen: NAD, resting comfortably CV: RRR with no murmurs appreciated Pulm: NWOB, CTAB with no crackles, wheezes, or rhonchi GI: Normal bowel sounds present. Soft, Nontender, Nondistended. MSK: No edema, cyanosis, or clubbing noted Skin: Warm, dry Neuro: Grossly normal, moves all extremities Psych: Normal affect and thought content  Assessment/Plan:  Vasculogenic erectile dysfunction Likely worsening due to side effect of Zoloft.  Advised against dual use of Viagra and Cialis, however it is interesting the patient was able to tolerate both without side effects.  We will start Wellbutrin 150 mg daily to help mitigate side effects of the Zoloft.  He will continue taking Cialis 10 mg daily as needed.  Would consider increasing dose of Cialis to 20 mg daily or switching to Viagra 100 mg as needed  if symptoms do not improve with Wellbutrin.  Major depressive disorder, recurrent episode, moderate (HCC) Improved.  Continue Zoloft 100 mg daily.  We will also start Wellbutrin 150 mg daily given his concurrent side effects.  He will follow-up with me in 4 to 6 weeks.  Essential hypertension Slightly above goal today.  Typically well controlled.  Continue losartan 50 mg daily.  Continue home blood pressure monitoring.  Anxiety See above.  Continue Zoloft 100 mg daily.  Follow-up in 4 to 6 weeks.  Wean off Valium as tolerated.  Algis Greenhouse. Jerline Pain, MD 01/01/2018 12:13 PM

## 2018-01-01 NOTE — Assessment & Plan Note (Signed)
Likely worsening due to side effect of Zoloft.  Advised against dual use of Viagra and Cialis, however it is interesting the patient was able to tolerate both without side effects.  We will start Wellbutrin 150 mg daily to help mitigate side effects of the Zoloft.  He will continue taking Cialis 10 mg daily as needed.  Would consider increasing dose of Cialis to 20 mg daily or switching to Viagra 100 mg as needed if symptoms do not improve with Wellbutrin.

## 2018-01-01 NOTE — Assessment & Plan Note (Signed)
Improved.  Continue Zoloft 100 mg daily.  We will also start Wellbutrin 150 mg daily given his concurrent side effects.  He will follow-up with me in 4 to 6 weeks.

## 2018-01-01 NOTE — Patient Instructions (Addendum)
It was very nice to see you today!  I am glad that you are feeling better! I think wellbutrin will help with your side effects. Please take 150mg  daily.   Please send me a mychart message in 4-6 weeks checking in.  Come back to see me sooner if needed.   Take care, Dr Jerline Pain

## 2018-01-01 NOTE — Assessment & Plan Note (Signed)
Slightly above goal today.  Typically well controlled.  Continue losartan 50 mg daily.  Continue home blood pressure monitoring.

## 2018-01-01 NOTE — Assessment & Plan Note (Addendum)
See above.  Continue Zoloft 100 mg daily.  Follow-up in 4 to 6 weeks.  Wean off Valium as tolerated.

## 2018-03-14 HISTORY — PX: PROSTATECTOMY: SHX69

## 2018-05-12 ENCOUNTER — Ambulatory Visit: Payer: Self-pay | Admitting: *Deleted

## 2018-05-12 NOTE — Telephone Encounter (Signed)
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 He was in the office shoulder to shoulder with this person on Wednesday, 4/22 Used her mouse and with her for less than one hour. Reported she was not symptomatic at that time. Patient denies difficulty breathing/fever. He does have seasonal allergies and has not been taking allergy medication. Has mild runny nose and congestion. He will begin isolation for 14 days. Reviewed mask wearing, disinfectant precautions and 3 days without symptoms without medication to come off isolation. He will be tested by the health department on Friday at his employment.reviewed symptoms he would need to seek immediate intervention for. He is most aware of social distancing. Stated he understood.  Routing to PCP as  fyi. Reason for Disposition . [1] COVID-19 EXPOSURE within last 14 days AND [2] NO cough, fever, or breathing difficulty AND [3] exposed person is a Dietitian who was NOT using all recommended personal protective equipment (i.e., a respirator-N95 mask, eye protection, gloves, and gown)  Answer Assessment - Initial Assessment Questions 1. 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 PLACE of CONTACT: "Where were you when you were exposed to COVID-19?" (e.g., home, school, medical waiting room; which city?)     In her office, [place]. 3. TYPE of CONTACT: "How much contact was there?" (e.g., sitting next to, live in same house, work in same office, same building)     Over the shoulder and using same mouse 4. DURATION of CONTACT: "How long were you in contact with the COVID-19 patient?" (e.g., a few seconds, passed by person, a few minutes, live with the patient)     15 minutes  5. DATE of CONTACT: "When did you have contact with a COVID-19  patient?" (e.g., how many days ago)     Wednesday 6. TRAVEL: "Have you traveled out of the country recently?" If so, "When and where?"     * Also ask about out-of-state travel, since the CDC has identified some high risk cities for community spread in the [place].     * Note: Travel becomes less relevant if there is widespread community transmission where the patient lives.    No travels 7. COMMUNITY SPREAD: "Are there lots of cases or COVID-19 (community spread) where you live?" (See public health department website, if unsure)   * MAJOR community spread: high number of cases; numbers of cases are increasing; many people hospitalized.   * MINOR community spread: low number of cases; not increasing; few or no people hospitalized     minor 8. SYMPTOMS: "Do you have any symptoms?" (e.g., fever, cough, breathing difficulty)     Mild congestion and runny nose, may be allergies. 9. PREGNANCY OR POSTPARTUM: "Is there any chance you are pregnant?" "When was your last menstrual period?" "Did you deliver in the last 2 weeks?"     na 10. HIGH RISK: "Do you have any heart or lung problems? Do you have a weak immune system?" (e.g., CHF, COPD, asthma, HIV positive, chemotherapy, renal failure, diabetes mellitus, sickle cell anemia)       no  Protocols used: CORONAVIRUS (COVID-19) EXPOSURE-A-AH

## 2018-05-13 NOTE — Telephone Encounter (Signed)
Noted.  Noah Gonzalez. Jerline Pain, MD 05/13/2018 11:23 AM

## 2018-05-13 NOTE — Telephone Encounter (Signed)
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

## 2018-05-13 NOTE — Telephone Encounter (Signed)
FYI

## 2018-05-13 NOTE — Telephone Encounter (Signed)
Noah Gonzalez, please schedule virtual visit for today

## 2018-05-13 NOTE — Telephone Encounter (Signed)
Called pt to schedule virtual visit. No answer, LVM.

## 2018-05-24 ENCOUNTER — Ambulatory Visit (INDEPENDENT_AMBULATORY_CARE_PROVIDER_SITE_OTHER): Payer: No Typology Code available for payment source | Admitting: Family Medicine

## 2018-05-24 ENCOUNTER — Encounter: Payer: Self-pay | Admitting: Family Medicine

## 2018-05-24 DIAGNOSIS — N529 Male erectile dysfunction, unspecified: Secondary | ICD-10-CM

## 2018-05-24 DIAGNOSIS — F419 Anxiety disorder, unspecified: Secondary | ICD-10-CM | POA: Diagnosis not present

## 2018-05-24 DIAGNOSIS — I10 Essential (primary) hypertension: Secondary | ICD-10-CM

## 2018-05-24 DIAGNOSIS — F331 Major depressive disorder, recurrent, moderate: Secondary | ICD-10-CM | POA: Diagnosis not present

## 2018-05-24 DIAGNOSIS — C61 Malignant neoplasm of prostate: Secondary | ICD-10-CM

## 2018-05-24 MED ORDER — SILDENAFIL CITRATE 100 MG PO TABS: 50.0000 mg | ORAL_TABLET | Freq: Every day | ORAL | 5 refills | Status: DC | PRN

## 2018-05-24 MED ORDER — SERTRALINE HCL 50 MG PO TABS: 50.0000 mg | ORAL_TABLET | Freq: Every day | ORAL | 1 refills | Status: DC

## 2018-05-24 NOTE — Assessment & Plan Note (Signed)
Stable. Continue zoloft 50mg  daily and wellbutrin 150mg  daily.

## 2018-05-24 NOTE — Assessment & Plan Note (Signed)
Stable.  Continue Zoloft 50 mg daily and Valium as needed.

## 2018-05-24 NOTE — Assessment & Plan Note (Signed)
Worsened since prostatectomy.  Will start Viagra.  Discussed potential side effects.

## 2018-05-24 NOTE — Assessment & Plan Note (Signed)
Stable.  Continue losartan 50mg daily

## 2018-05-24 NOTE — Progress Notes (Signed)
    Chief Complaint:  Noah Gonzalez is a 48 y.o. male who presents today for a virtual office visit with a chief complaint of ED follow up.   Assessment/Plan:  Prostate cancer [facility] s/p prostatectomy 2020 Stable. Has underwent prostatectomy since our last visit. Recovering well and has undetectable PSA.   Major depressive disorder, recurrent episode, moderate (HCC) Stable. Continue zoloft 50mg  daily and wellbutrin 150mg  daily.   Essential hypertension Stable. Continue losartan 50mg  daily.   Erectile dysfunction Worsened since prostatectomy.  Will start Viagra.  Discussed potential side effects.  Anxiety Stable.  Continue Zoloft 50 mg daily and Valium as needed.  Preventative Healthcare Patient was instructed to return soon for CPE. Health Maintenance Due  Topic Date Due  . HIV Screening  08/02/1983  . TETANUS/TDAP  08/02/1987        Subjective:  HPI:  Chronic Problems are outlined below:   # Hypertension - On losartan 50mg  daily and tolerating well - Home BPs 130s/80s - ROS: No reported chest pain or shortness of breath  # Depression / Anxiety - On zoloft 50mg  and wellbutrin 150mg  daily - Takes valium as needed - ROS: No reported SI or HI  # Erectile Dysfunction -Worsened recently.  Has been on Viagra and Cialis in the past but not currently taking anything.  % Prostate Cancer s/p prostatectomy 2020  - Follows with urology  ROS: Per HPI  PMH: He reports that he quit smoking about 13 years ago. His smoking use included cigarettes. He quit after 2.00 years of use. He has quit using smokeless tobacco.  His smokeless tobacco use included chew. He reports that he does not drink alcohol or use drugs.      Objective/Observations  Physical Exam: Gen: NAD, resting comfortably Pulm: Normal work of breathing Neuro: Grossly normal, moves all extremities Psych: Normal affect and thought content  Virtual Visit via Video   I connected with Noah Gonzalez on  05/24/18 at 10:00 AM EDT by a video enabled telemedicine application and verified that I am speaking with the correct person using two identifiers. I discussed the limitations of evaluation and management by telemedicine and the availability of in person appointments. The patient expressed understanding and agreed to proceed.   Patient location: Home Provider location: [place] participating in the virtual visit: Myself and Patient     Algis Greenhouse. Jerline Pain, MD 05/24/2018 10:19 AM

## 2018-05-24 NOTE — Assessment & Plan Note (Signed)
Stable. Has underwent prostatectomy since our last visit. Recovering well and has undetectable PSA.

## 2018-08-18 ENCOUNTER — Other Ambulatory Visit: Payer: Self-pay | Admitting: Family Medicine

## 2019-01-21 ENCOUNTER — Other Ambulatory Visit: Payer: Self-pay | Admitting: Family Medicine

## 2019-09-01 ENCOUNTER — Other Ambulatory Visit: Payer: Self-pay

## 2019-09-01 ENCOUNTER — Encounter: Payer: Self-pay | Admitting: Family Medicine

## 2019-09-01 ENCOUNTER — Ambulatory Visit (INDEPENDENT_AMBULATORY_CARE_PROVIDER_SITE_OTHER): Payer: No Typology Code available for payment source | Admitting: Family Medicine

## 2019-09-01 VITALS — BP 183/84 | HR 75 | Temp 98.1°F | Ht 69.0 in | Wt 247.0 lb

## 2019-09-01 DIAGNOSIS — I10 Essential (primary) hypertension: Secondary | ICD-10-CM | POA: Diagnosis not present

## 2019-09-01 DIAGNOSIS — F331 Major depressive disorder, recurrent, moderate: Secondary | ICD-10-CM

## 2019-09-01 DIAGNOSIS — F419 Anxiety disorder, unspecified: Secondary | ICD-10-CM

## 2019-09-01 DIAGNOSIS — G4700 Insomnia, unspecified: Secondary | ICD-10-CM | POA: Diagnosis not present

## 2019-09-01 DIAGNOSIS — E669 Obesity, unspecified: Secondary | ICD-10-CM

## 2019-09-01 MED ORDER — TADALAFIL 10 MG PO TABS: 10.0000 mg | ORAL_TABLET | Freq: Every day | ORAL | 5 refills | Status: DC | PRN

## 2019-09-01 MED ORDER — TRAZODONE HCL 50 MG PO TABS: 25.0000 mg | ORAL_TABLET | Freq: Every evening | ORAL | 3 refills | Status: DC | PRN

## 2019-09-01 MED ORDER — BUPROPION HCL ER (XL) 150 MG PO TB24: 150.0000 mg | ORAL_TABLET | Freq: Every day | ORAL | 3 refills | Status: DC

## 2019-09-01 MED ORDER — LOSARTAN POTASSIUM 50 MG PO TABS: 100.0000 mg | ORAL_TABLET | Freq: Every day | ORAL | 3 refills | Status: DC

## 2019-09-01 NOTE — Assessment & Plan Note (Signed)
BMI 36.48.  Will restart Wellbutrin which I think should help with food cravings.  He will continue working on diet and exercise.  He will check with me in a couple of weeks.  Will consider trial of Ozempic if has continued issues with food cravings.

## 2019-09-01 NOTE — Assessment & Plan Note (Signed)
Above goal.  Will increase losartan to 100 mg daily.  He will work on diet and exercise.  He will continue home monitoring goal 140/90 or lower.

## 2019-09-01 NOTE — Patient Instructions (Signed)
It was very nice to see you today!  We will restart your Wellbutrin.  Please increase your losartan to 100 mg daily.  Keep an eye on your blood pressure and let me know if persistently 140/90 or higher.  I think the Wellbutrin will help reduce your cravings.  If you would like additional help with losing weight please let me know in a couple weeks and we can try Ozempic.  I will also send in trazodone for you.  Take care, Dr Jerline Pain  Please try these tips to maintain a healthy lifestyle:   Eat at least 3 REAL meals and 1-2 snacks per day.  Aim for no more than 5 hours between eating.  If you eat breakfast, please do so within one hour of getting up.    Each meal should contain half fruits/vegetables, one quarter protein, and one quarter carbs (no bigger than a computer mouse)   Cut down on sweet beverages. This includes juice, soda, and sweet tea.     Drink at least 1 glass of water with each meal and aim for at least 8 glasses per day   Exercise at least 150 minutes every week.

## 2019-09-01 NOTE — Assessment & Plan Note (Signed)
Not well controlled on melatonin.  We will start trazodone.

## 2019-09-01 NOTE — Assessment & Plan Note (Signed)
Restart Wellbutrin.  Fully this will help some with food cravings as well.  Continue Zoloft 25 mg daily.

## 2019-09-01 NOTE — Progress Notes (Signed)
   Noah Gonzalez is a 49 y.o. male who presents today for an office visit.  Assessment/Plan:  Chronic Problems Addressed Today: Insomnia Not well controlled on melatonin.  We will start trazodone.  Anxiety Stable on Zoloft 25 mg daily.  Major depressive disorder, recurrent episode, moderate (HCC) Restart Wellbutrin.  Fully this will help some with food cravings as well.  Continue Zoloft 25 mg daily.  Essential hypertension Above goal.  Will increase losartan to 100 mg daily.  He will work on diet and exercise.  He will continue home monitoring goal 140/90 or lower.  Obesity BMI 36.48.  Will restart Wellbutrin which I think should help with food cravings.  He will continue working on diet and exercise.  He will check with me in a couple of weeks.  Will consider trial of Ozempic if has continued issues with food cravings.     Subjective:  HPI:  See A/p.         Objective:  Physical Exam: BP (!) 183/84   Pulse 75   Temp 98.1 F (36.7 C) (Temporal)   Ht 5\' 9"  (1.753 m)   Wt 247 lb (112 kg)   SpO2 98%   BMI 36.48 kg/m   Wt Readings from Last 3 Encounters:  09/01/19 247 lb (112 kg)  01/01/18 228 lb 12 oz (103.8 kg)  11/24/17 222 lb 6.4 oz (100.9 kg)    Gen: No acute distress, resting comfortably CV: Regular rate and rhythm with no murmurs appreciated Pulm: Normal work of breathing, clear to auscultation bilaterally with no crackles, wheezes, or rhonchi Neuro: Grossly normal, moves all extremities Psych: Normal affect and thought content      Kaytelynn Scripter M. Jerline Pain, MD 09/01/2019 2:52 PM

## 2019-09-01 NOTE — Assessment & Plan Note (Signed)
Stable on Zoloft 25 mg daily. 

## 2019-09-30 ENCOUNTER — Telehealth: Payer: Self-pay | Admitting: Family Medicine

## 2019-09-30 NOTE — Telephone Encounter (Signed)
Patient is calling in this afternoon to let Dr.Parker know that he has been doing well on all medicines and is ready to try Ozempic, but is concerned with the cost.

## 2019-09-30 NOTE — Telephone Encounter (Signed)
FYI   Lvm to patient to check good Rx for price

## 2019-10-03 NOTE — Telephone Encounter (Signed)
Ozempic is now covered under most insurance plans - he can check with them to see if it is covered.  Algis Greenhouse. Jerline Pain, MD 10/03/2019 8:07 AM

## 2019-10-03 NOTE — Telephone Encounter (Signed)
Pt will call insurance for coverage, will call us with information

## 2019-10-17 ENCOUNTER — Encounter: Payer: Self-pay | Admitting: Family Medicine

## 2019-10-20 ENCOUNTER — Encounter: Payer: Self-pay | Admitting: Family Medicine

## 2019-10-20 ENCOUNTER — Ambulatory Visit (INDEPENDENT_AMBULATORY_CARE_PROVIDER_SITE_OTHER): Payer: BC Managed Care – PPO | Admitting: Family Medicine

## 2019-10-20 ENCOUNTER — Other Ambulatory Visit: Payer: Self-pay

## 2019-10-20 VITALS — BP 118/65 | HR 74 | Temp 97.4°F | Ht 69.0 in | Wt 238.8 lb

## 2019-10-20 DIAGNOSIS — Z13220 Encounter for screening for lipoid disorders: Secondary | ICD-10-CM

## 2019-10-20 DIAGNOSIS — Z1211 Encounter for screening for malignant neoplasm of colon: Secondary | ICD-10-CM

## 2019-10-20 DIAGNOSIS — F331 Major depressive disorder, recurrent, moderate: Secondary | ICD-10-CM

## 2019-10-20 DIAGNOSIS — I10 Essential (primary) hypertension: Secondary | ICD-10-CM | POA: Diagnosis not present

## 2019-10-20 DIAGNOSIS — E669 Obesity, unspecified: Secondary | ICD-10-CM | POA: Diagnosis not present

## 2019-10-20 DIAGNOSIS — Z23 Encounter for immunization: Secondary | ICD-10-CM

## 2019-10-20 DIAGNOSIS — Z0001 Encounter for general adult medical examination with abnormal findings: Secondary | ICD-10-CM | POA: Diagnosis not present

## 2019-10-20 DIAGNOSIS — F419 Anxiety disorder, unspecified: Secondary | ICD-10-CM

## 2019-10-20 DIAGNOSIS — R739 Hyperglycemia, unspecified: Secondary | ICD-10-CM

## 2019-10-20 MED ORDER — OZEMPIC (0.25 OR 0.5 MG/DOSE) 2 MG/1.5ML ~~LOC~~ SOPN: 0.2500 mg | PEN_INJECTOR | SUBCUTANEOUS | 0 refills | Status: DC

## 2019-10-20 NOTE — Telephone Encounter (Signed)
Pt will like to proceed with Rx Ozempic

## 2019-10-20 NOTE — Patient Instructions (Signed)
It was very nice to see you today!  Please start the [place].  Take 0.25 mg weekly for few weeks then increase to 0.5 mg weekly.  Follow-up with me in 2 to 3 months.  We will check blood work today.   Take care, Dr Jerline Pain  Please try these tips to maintain a healthy lifestyle:   Eat at least 3 REAL meals and 1-2 snacks per day.  Aim for no more than 5 hours between eating.  If you eat breakfast, please do so within one hour of getting up.    Each meal should contain half fruits/vegetables, one quarter protein, and one quarter carbs (no bigger than a computer mouse)   Cut down on sweet beverages. This includes juice, soda, and sweet tea.     Drink at least 1 glass of water with each meal and aim for at least 8 glasses per day   Exercise at least 150 minutes every week.    Preventive Care 42-35 Years Old, Male Preventive care refers to lifestyle choices and visits with your health care provider that can promote health and wellness. This includes:  A yearly physical exam. This is also called an annual well check.  Regular dental and eye exams.  Immunizations.  Screening for certain conditions.  Healthy lifestyle choices, such as eating a healthy diet, getting regular exercise, not using drugs or products that contain nicotine and tobacco, and limiting alcohol use. What can I expect for my preventive care visit? Physical exam Your health care provider will check:  Height and weight. These may be used to calculate body mass index (BMI), which is a measurement that tells if you are at a healthy weight.  Heart rate and blood pressure.  Your skin for abnormal spots. Counseling Your health care provider may ask you questions about:  Alcohol, tobacco, and drug use.  Emotional well-being.  Home and relationship well-being.  Sexual activity.  Eating habits.  Work and work Statistician. What immunizations do I need?  Influenza (flu) vaccine  This is recommended  every year. Tetanus, diphtheria, and pertussis (Tdap) vaccine  You may need a Td booster every 10 years. Varicella (chickenpox) vaccine  You may need this vaccine if you have not already been vaccinated. Zoster (shingles) vaccine  You may need this after age 80. Measles, mumps, and rubella (MMR) vaccine  You may need at least one dose of MMR if you were born in 1957 or later. You may also need a second dose. Pneumococcal conjugate (PCV13) vaccine  You may need this if you have certain conditions and were not previously vaccinated. Pneumococcal polysaccharide (PPSV23) vaccine  You may need one or two doses if you smoke cigarettes or if you have certain conditions. Meningococcal conjugate (MenACWY) vaccine  You may need this if you have certain conditions. Hepatitis A vaccine  You may need this if you have certain conditions or if you travel or work in places where you may be exposed to hepatitis A. Hepatitis B vaccine  You may need this if you have certain conditions or if you travel or work in places where you may be exposed to hepatitis B. Haemophilus influenzae type b (Hib) vaccine  You may need this if you have certain risk factors. Human papillomavirus (HPV) vaccine  If recommended by your health care provider, you may need three doses over 6 months. You may receive vaccines as individual doses or as more than one vaccine together in one shot (combination vaccines). Talk with your  health care provider about the risks and benefits of combination vaccines. What tests do I need? Blood tests  Lipid and cholesterol levels. These may be checked every 5 years, or more frequently if you are over 65 years old.  Hepatitis C test.  Hepatitis B test. Screening  Lung cancer screening. You may have this screening every year starting at age 74 if you have a 30-pack-year history of smoking and currently smoke or have quit within the past 15 years.  Prostate cancer screening.  Recommendations will vary depending on your family history and other risks.  Colorectal cancer screening. All adults should have this screening starting at age 31 and continuing until age 63. Your health care provider may recommend screening at age 32 if you are at increased risk. You will have tests every 1-10 years, depending on your results and the type of screening test.  Diabetes screening. This is done by checking your blood sugar (glucose) after you have not eaten for a while (fasting). You may have this done every 1-3 years.  Sexually transmitted disease (STD) testing. Follow these instructions at home: Eating and drinking  Eat a diet that includes fresh fruits and vegetables, whole grains, lean protein, and low-fat dairy products.  Take vitamin and mineral supplements as recommended by your health care provider.  Do not drink alcohol if your health care provider tells you not to drink.  If you drink alcohol: ? Limit how much you have to 0-2 drinks a day. ? Be aware of how much alcohol is in your drink. In the [place] one drink equals one 12 oz bottle of beer (355 mL), one 5 oz glass of wine (148 mL), or one 1 oz glass of hard liquor (44 mL). Lifestyle  Take daily care of your teeth and gums.  Stay active. Exercise for at least 30 minutes on 5 or more days each week.  Do not use any products that contain nicotine or tobacco, such as cigarettes, e-cigarettes, and chewing tobacco. If you need help quitting, ask your health care provider.  If you are sexually active, practice safe sex. Use a condom or other form of protection to prevent STIs (sexually transmitted infections).  Talk with your health care provider about taking a low-dose aspirin every day starting at age 17. What's next?  Go to your health care provider once a year for a well check visit.  Ask your health care provider how often you should have your eyes and teeth checked.  Stay up to date on all vaccines. This  information is not intended to replace advice given to you by your health care provider. Make sure you discuss any questions you have with your health care provider. Document Revised: 12/24/2017 Document Reviewed: 12/24/2017 Elsevier Patient Education  2020 [employer].

## 2019-10-20 NOTE — Assessment & Plan Note (Signed)
Doing well on wellbutrin 50 mg daily.  We will start Ozempic as well.  He will follow-up with me in 3 months.  Start prescription 0.25 mg weekly for a few weeks and then increase to 0.5 mg weekly.

## 2019-10-20 NOTE — Assessment & Plan Note (Signed)
Stable.  Continue Zoloft 50 mg daily. 

## 2019-10-20 NOTE — Progress Notes (Signed)
Chief Complaint:  Noah Gonzalez is a 49 y.o. male who presents today for his annual comprehensive physical exam.    Assessment/Plan:  Chronic Problems Addressed Today: Obesity Doing well on wellbutrin 50 mg daily.  We will start Ozempic as well.  He will follow-up with me in 3 months.  Start prescription 0.25 mg weekly for a few weeks and then increase to 0.5 mg weekly.  Anxiety Stable.  Continue Zoloft 50 mg daily.   Major depressive disorder, recurrent episode, moderate (HCC) Stable.  Continue Zoloft 50 mg daily.  Essential hypertension Doing much better on losartan 100 mg daily.  Will check CMET today.   Preventative Healthcare: We will place order for Cologuard.  Check lipid panel.  Check A1c.  Patient Counseling(The following topics were reviewed and/or handout was given):  -Nutrition: Stressed importance of moderation in sodium/caffeine intake, saturated fat and cholesterol, caloric balance, sufficient intake of fresh fruits, vegetables, and fiber.  -Stressed the importance of regular exercise.   -Substance Abuse: Discussed cessation/primary prevention of tobacco, alcohol, or other drug use; driving or other dangerous activities under the influence; availability of treatment for abuse.   -Injury prevention: Discussed safety belts, safety helmets, smoke detector, smoking near bedding or upholstery.   -Sexuality: Discussed sexually transmitted diseases, partner selection, use of condoms, avoidance of unintended pregnancy and contraceptive alternatives.   -Dental health: Discussed importance of regular tooth brushing, flossing, and dental visits.  -Health maintenance and immunizations reviewed. Please refer to Health maintenance section.  Return to care in 1 year for next preventative visit.     Subjective:  HPI:  He has no acute complaints today.   See A/p for status of chronic conditions.   Lifestyle Diet: Trying to cut down on carbs.  Exercise: Working on  paddleboarding.   Depression screen PHQ 2/9 01/01/2018  Decreased Interest 1  Down, Depressed, Hopeless 1  PHQ - 2 Score 2  Altered sleeping 0  Tired, decreased energy 2  Change in appetite 1  Feeling bad or failure about yourself  0  Trouble concentrating 1  Moving slowly or fidgety/restless 0  Suicidal thoughts 0  PHQ-9 Score 6  Difficult doing work/chores Not difficult at all    Health Maintenance Due  Topic Date Due  . Hepatitis C Screening  Never done  . HIV Screening  Never done  . COLONOSCOPY  Never done     ROS: Per HPI, otherwise a complete review of systems was negative.   PMH:  The following were reviewed and entered/updated in epic: Past Medical History:  Diagnosis Date  . Arthritis    osteoarthritis-hips  . Cancer ([place]) 11/13/2017   prostate cancer  . GERD (gastroesophageal reflux disease)    12/26/15- states has resolved  . Hypertension    history of- has lost 40 lbs  . Low serum testosterone level    recent tx. done   Patient Active Problem List   Diagnosis Date Noted  . Insomnia 09/01/2019  . Obesity 09/01/2019  . Prostate cancer [facility]) s/p prostatectomy 2020 05/24/2018  . Major depressive disorder, recurrent episode, moderate ([place]) 11/24/2017  . Anxiety 11/24/2017  . Palpitations 06/19/2017  . Family history of prostate cancer 06/19/2017  . History of bilateral hip replacements 01/04/2016  . Gynecomastia 02/02/2015  . Erectile dysfunction 11/20/2014  . Essential hypertension 11/03/2014  . 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 PROSTATECTOMY Bilateral 03/14/2018  . TOTAL HIP ARTHROPLASTY Right 01/21/2013  Procedure: RIGHT TOTAL HIP ARTHROPLASTY ANTERIOR APPROACH;  Surgeon: Mcarthur Rossetti, MD;  Location: [facility];  Service: Orthopedics;  Laterality: Right;  . TOTAL HIP ARTHROPLASTY Left 01/04/2016   Procedure: LEFT TOTAL HIP ARTHROPLASTY ANTERIOR APPROACH;  Surgeon: Mcarthur Rossetti,  MD;  Location: [facility];  Service: Orthopedics;  Laterality: Left;  . WISDOM TOOTH EXTRACTION      Family History  Problem Relation Age of Onset  . Pancreatic cancer Mother   . Lung cancer Maternal Grandfather   . Prostate cancer Paternal Grandfather     Medications- reviewed and updated Current Outpatient Medications  Medication Sig Dispense Refill  . buPROPion (WELLBUTRIN XL) 150 MG 24 hr tablet Take 1 tablet (150 mg total) by mouth daily. 90 tablet 3  . losartan (COZAAR) 50 MG tablet Take 2 tablets (100 mg total) by mouth daily. 180 tablet 3  . Melatonin 10 MG CAPS Take by mouth.    . sertraline (ZOLOFT) 50 MG tablet TAKE 1 TABLET BY MOUTH ONCE DAILY FOR 7 DAYS, THEN 2 TABLETS  DAILY FOR 7 DAYS,THEN  3 TABLETS DAILY 180 tablet 0  . tadalafil (CIALIS) 10 MG tablet Take 1 tablet (10 mg total) by mouth daily as needed for erectile dysfunction. 90 tablet 5  . traZODone (DESYREL) 50 MG tablet Take 0.5-1 tablets (25-50 mg total) by mouth at bedtime as needed for sleep. 30 tablet 3  . Semaglutide,0.25 or 0.5MG /DOS, (OZEMPIC, 0.25 OR 0.5 MG/DOSE,) 2 MG/1.5ML SOPN Inject 0.25 mg into the skin once a week. For 4 weeks then increase to 0.5mg  weekly. (Patient not taking: Reported on 10/20/2019) 1.5 mL 0   No current facility-administered medications for this visit.    Allergies-reviewed and updated Allergies  Allergen Reactions  . Hydrocodone Nausea And Vomiting    Headache   . Oxycodone Hcl Other (See Comments)    Causes severe headache    Social History   Socioeconomic History  . Marital status: Married    Spouse name: Not on file  . Number of children: 0  . Years of education: Not on file  . Highest education level: Not on file  Occupational History    Comment: [employer]  Tobacco Use  . Smoking status: Former Smoker    Years: 2.00    Types: Cigarettes    Quit date: 01/17/2005    Years since quitting: 14.7  . Smokeless tobacco: Former Systems developer    Types: Chew  . Tobacco  comment: quit 6 yrs ago  Substance and Sexual Activity  . Alcohol use: No  . Drug use: No  . Sexual activity: Yes  Other Topics Concern  . Not on file  Social History Narrative  . Not on file   Social Determinants of Health   Financial Resource Strain:   . Difficulty of Paying Living Expenses: Not on file  Food Insecurity:   . Worried About Charity fundraiser in the Last Year: Not on file  . Ran Out of Food in the Last Year: Not on file  Transportation Needs:   . Lack of Transportation (Medical): Not on file  . Lack of Transportation (Non-Medical): Not on file  Physical Activity:   . Days of Exercise per Week: Not on file  . Minutes of Exercise per Session: Not on file  Stress:   . Feeling of Stress : Not on file  Social Connections:   . Frequency of Communication with Friends and Family: Not on file  . Frequency of Social  Gatherings with Friends and Family: Not on file  . Attends Religious Services: Not on file  . Active Member of Clubs or Organizations: Not on file  . Attends Archivist Meetings: Not on file  . Marital Status: Not on file        Objective:  Physical Exam: BP 118/65   Pulse 74   Temp (!) 97.4 F (36.3 C) (Temporal)   Ht 5\' 9"  (1.753 m)   Wt 238 lb 12.8 oz (108.3 kg)   SpO2 97%   BMI 35.26 kg/m   Body mass index is 35.26 kg/m. Wt Readings from Last 3 Encounters:  10/20/19 238 lb 12.8 oz (108.3 kg)  09/01/19 247 lb (112 kg)  01/01/18 228 lb 12 oz (103.8 kg)   Gen: NAD, resting comfortably HEENT: TMs normal bilaterally. OP clear. No thyromegaly noted.  CV: RRR with no murmurs appreciated Pulm: NWOB, CTAB with no crackles, wheezes, or rhonchi GI: Normal bowel sounds present. Soft, Nontender, Nondistended. MSK: no edema, cyanosis, or clubbing noted Skin: warm, dry Neuro: CN2-12 grossly intact. Strength 5/5 in upper and lower extremities. Reflexes symmetric and intact bilaterally.  Psych: Normal affect and thought content      Kerrin Markman M. Jerline Pain, MD 10/20/2019 2:08 PM

## 2019-10-20 NOTE — Assessment & Plan Note (Signed)
Doing much better on losartan 100 mg daily.  Will check CMET today.

## 2019-10-21 ENCOUNTER — Telehealth: Payer: Self-pay

## 2019-10-21 NOTE — Telephone Encounter (Signed)
Initial Comment Caller states he was prescribed Ozempic and his insurance won't cover. Dr. Marigene Ehlers nurse advised she has a coupon for the medication. The pharmacy is [facility] and the phone number is [phone]. No current symptoms Translation No Disp. Time Eilene Ghazi Time) Disposition Final User 10/20/2019 6:26:54 PM Attempt made - message left Genelle Gather, RN, Magda Paganini 10/20/2019 6:47:08 PM Send to RN Final Attempt Burna Mortimer, RN, Magda Paganini 10/20/2019 6:47:01 PM FINAL ATTEMPT MADE - no message left Yes Huffine, RN, Lyda Perone

## 2019-10-24 NOTE — Telephone Encounter (Signed)
LVM to return call  Did pt pick up coupon?

## 2019-10-25 ENCOUNTER — Telehealth: Payer: Self-pay

## 2019-10-25 ENCOUNTER — Other Ambulatory Visit: Payer: No Typology Code available for payment source

## 2019-10-25 MED ORDER — OZEMPIC (0.25 OR 0.5 MG/DOSE) 2 MG/1.5ML ~~LOC~~ SOPN: 0.2500 mg | PEN_INJECTOR | SUBCUTANEOUS | 3 refills | Status: DC

## 2019-10-25 NOTE — Telephone Encounter (Signed)
Ozempic sent in

## 2019-10-25 NOTE — Telephone Encounter (Signed)
MEDICATION: Ozempic 2mg   PHARMACY: [facility] [place]  Comments:   **Let patient know to contact pharmacy at the end of the day to make sure medication is ready. **  ** Please notify patient to allow 48-72 hours to process**  **Encourage patient to contact the pharmacy for refills or they can request refills through [facility]**

## 2019-10-26 ENCOUNTER — Encounter: Payer: Self-pay | Admitting: Family Medicine

## 2019-10-27 NOTE — Telephone Encounter (Signed)
Pt pick up coupon

## 2019-10-27 NOTE — Telephone Encounter (Signed)
Spoke with patient Patient notified, information for coupon placed at the back of the plantlet Will check for samples in our clinic

## 2019-10-31 ENCOUNTER — Other Ambulatory Visit: Payer: Self-pay | Admitting: Family Medicine

## 2019-11-16 NOTE — Telephone Encounter (Signed)
Insurance not covering Ozempic  Pt will like to try a different medication  Please advise

## 2019-11-30 DIAGNOSIS — N62 Hypertrophy of breast: Secondary | ICD-10-CM | POA: Diagnosis not present

## 2019-11-30 DIAGNOSIS — I10 Essential (primary) hypertension: Secondary | ICD-10-CM | POA: Diagnosis not present

## 2019-11-30 DIAGNOSIS — R972 Elevated prostate specific antigen [PSA]: Secondary | ICD-10-CM | POA: Diagnosis not present

## 2019-11-30 DIAGNOSIS — E291 Testicular hypofunction: Secondary | ICD-10-CM | POA: Diagnosis not present

## 2019-12-05 DIAGNOSIS — N62 Hypertrophy of breast: Secondary | ICD-10-CM | POA: Diagnosis not present

## 2019-12-05 DIAGNOSIS — E291 Testicular hypofunction: Secondary | ICD-10-CM | POA: Diagnosis not present

## 2019-12-05 DIAGNOSIS — C61 Malignant neoplasm of prostate: Secondary | ICD-10-CM | POA: Diagnosis not present

## 2019-12-05 DIAGNOSIS — Z6835 Body mass index (BMI) 35.0-35.9, adult: Secondary | ICD-10-CM | POA: Diagnosis not present

## 2019-12-12 ENCOUNTER — Encounter: Payer: Self-pay | Admitting: Family Medicine

## 2020-01-01 ENCOUNTER — Other Ambulatory Visit: Payer: Self-pay | Admitting: Family Medicine

## 2020-03-07 ENCOUNTER — Other Ambulatory Visit: Payer: Self-pay

## 2020-03-07 ENCOUNTER — Other Ambulatory Visit (INDEPENDENT_AMBULATORY_CARE_PROVIDER_SITE_OTHER): Payer: No Typology Code available for payment source

## 2020-03-07 DIAGNOSIS — Z0001 Encounter for general adult medical examination with abnormal findings: Secondary | ICD-10-CM | POA: Diagnosis not present

## 2020-03-07 DIAGNOSIS — Z13220 Encounter for screening for lipoid disorders: Secondary | ICD-10-CM

## 2020-03-07 DIAGNOSIS — R739 Hyperglycemia, unspecified: Secondary | ICD-10-CM

## 2020-03-07 DIAGNOSIS — I10 Essential (primary) hypertension: Secondary | ICD-10-CM

## 2020-03-08 LAB — COMPREHENSIVE METABOLIC PANEL
AG Ratio: 1.7 (calc) (ref 1.0–2.5)
ALT: 25 U/L (ref 9–46)
AST: 31 U/L (ref 10–35)
Albumin: 4.3 g/dL (ref 3.6–5.1)
Alkaline phosphatase (APISO): 61 U/L (ref 35–144)
BUN: 13 mg/dL (ref 7–25)
CO2: 26 mmol/L (ref 20–32)
Calcium: 9.7 mg/dL (ref 8.6–10.3)
Chloride: 103 mmol/L (ref 98–110)
Creat: 1.07 mg/dL (ref 0.70–1.33)
Globulin: 2.6 g/dL (calc) (ref 1.9–3.7)
Glucose, Bld: 95 mg/dL (ref 65–99)
Potassium: 5 mmol/L (ref 3.5–5.3)
Sodium: 136 mmol/L (ref 135–146)
Total Bilirubin: 0.4 mg/dL (ref 0.2–1.2)
Total Protein: 6.9 g/dL (ref 6.1–8.1)

## 2020-03-08 LAB — LIPID PANEL
Cholesterol: 188 mg/dL (ref <200)
HDL: 55 mg/dL (ref >=40)
LDL Cholesterol (Calc): 107 mg/dL (calc) — ABNORMAL HIGH
Non-HDL Cholesterol (Calc): 133 mg/dL (calc) — ABNORMAL HIGH (ref <130)
Total CHOL/HDL Ratio: 3.4 (calc) (ref <5.0)
Triglycerides: 145 mg/dL (ref <150)

## 2020-03-08 LAB — CBC
HCT: 45.5 % (ref 38.5–50.0)
Hemoglobin: 15.6 g/dL (ref 13.2–17.1)
MCH: 31.6 pg (ref 27.0–33.0)
MCHC: 34.3 g/dL (ref 32.0–36.0)
MCV: 92.1 fL (ref 80.0–100.0)
MPV: 9.2 fL (ref 7.5–12.5)
Platelets: 263 10*3/uL (ref 140–400)
RBC: 4.94 10*6/uL (ref 4.20–5.80)
RDW: 12.1 % (ref 11.0–15.0)
WBC: 5.7 10*3/uL (ref 3.8–10.8)

## 2020-03-08 LAB — HEMOGLOBIN A1C
Hgb A1c MFr Bld: 5.4 % of total Hgb (ref <5.7)
Mean Plasma Glucose: 108 mg/dL
eAG (mmol/L): 6 mmol/L

## 2020-03-09 ENCOUNTER — Encounter: Payer: Self-pay | Admitting: Family Medicine

## 2020-03-09 ENCOUNTER — Ambulatory Visit (INDEPENDENT_AMBULATORY_CARE_PROVIDER_SITE_OTHER): Payer: No Typology Code available for payment source | Admitting: Family Medicine

## 2020-03-09 ENCOUNTER — Other Ambulatory Visit: Payer: Self-pay

## 2020-03-09 VITALS — BP 144/77 | HR 57 | Temp 97.6°F | Ht 69.0 in | Wt 239.2 lb

## 2020-03-09 DIAGNOSIS — E785 Hyperlipidemia, unspecified: Secondary | ICD-10-CM

## 2020-03-09 DIAGNOSIS — E669 Obesity, unspecified: Secondary | ICD-10-CM | POA: Diagnosis not present

## 2020-03-09 MED ORDER — OZEMPIC (0.25 OR 0.5 MG/DOSE) 2 MG/1.5ML ~~LOC~~ SOPN: 0.2500 mg | PEN_INJECTOR | SUBCUTANEOUS | 3 refills | Status: DC

## 2020-03-09 MED ORDER — TRAZODONE HCL 50 MG PO TABS
ORAL_TABLET | ORAL | 3 refills | Status: DC
Start: 2020-03-09 — End: 2020-06-22

## 2020-03-09 NOTE — Assessment & Plan Note (Signed)
Last LDL 107.  ASCVD risk for 4.6%.  Will continue working on weight loss as well.

## 2020-03-09 NOTE — Progress Notes (Signed)
° °  Noah Gonzalez is a 50 y.o. male who presents today for an office visit.  Assessment/Plan:  Chronic Problems Addressed Today: Dyslipidemia Last LDL 107.  ASCVD risk for 4.6%.  Will continue working on weight loss as well.  Obesity Insurance not pay for Ozempic previously but now he is on new insurance.  We will send in.  He will follow with me in a few weeks via MyChart.  Start Ozempic 0.25 mg weekly for a few weeks and then increase to 0.5 mg weekly.  Briefly discussed dietary changes and recommended decreasing carb intake.    Subjective:  HPI:  See A/p.         Objective:  Physical Exam: BP (!) 144/77    Pulse (!) 57    Temp 97.6 F (36.4 C) (Temporal)    Ht 5\' 9"  (1.753 m)    Wt 239 lb 3.2 oz (108.5 kg)    SpO2 95%    BMI 35.32 kg/m   Gen: No acute distress, resting comfortably Neuro: Grossly normal, moves all extremities Psych: Normal affect and thought content      [name] M. Jerline Pain, MD 03/09/2020 11:03 AM

## 2020-03-09 NOTE — Patient Instructions (Signed)
It was very nice to see you today!  We will start ozempic. Please take 0.25mg  weekly for 4 weeks and then increase to 0.5mg  weekly  We may try alternatives including trulicity, Bydureon, or Rybelsus if your insurance would not pay for the Ozempic  Please continue cutting down on carbs.  Please check in with me in a few weeks to let me know how you are doing with the Ozempic.  Take care, Dr Jerline Pain  Please try these tips to maintain a healthy lifestyle:   Eat at least 3 REAL meals and 1-2 snacks per day.  Aim for no more than 5 hours between eating.  If you eat breakfast, please do so within one hour of getting up.    Each meal should contain half fruits/vegetables, one quarter protein, and one quarter carbs (no bigger than a computer mouse)   Cut down on sweet beverages. This includes juice, soda, and sweet tea.     Drink at least 1 glass of water with each meal and aim for at least 8 glasses per day   Exercise at least 150 minutes every week.

## 2020-03-09 NOTE — Assessment & Plan Note (Signed)
Insurance not pay for Ozempic previously but now he is on new insurance.  We will send in.  He will follow with me in a few weeks via MyChart.  Start Ozempic 0.25 mg weekly for a few weeks and then increase to 0.5 mg weekly.  Briefly discussed dietary changes and recommended decreasing carb intake.

## 2020-04-09 ENCOUNTER — Other Ambulatory Visit: Payer: Self-pay | Admitting: Family Medicine

## 2020-06-22 ENCOUNTER — Other Ambulatory Visit: Payer: Self-pay

## 2020-06-22 MED ORDER — LOSARTAN POTASSIUM 50 MG PO TABS: 100.0000 mg | ORAL_TABLET | Freq: Every day | ORAL | 1 refills | Status: DC

## 2020-06-22 MED ORDER — TRAZODONE HCL 50 MG PO TABS: ORAL_TABLET | ORAL | 2 refills | Status: DC

## 2020-06-22 MED ORDER — BUPROPION HCL ER (XL) 150 MG PO TB24: 1.0000 | ORAL_TABLET | Freq: Every day | ORAL | 1 refills | Status: DC

## 2020-06-22 MED ORDER — TADALAFIL 10 MG PO TABS: 10.0000 mg | ORAL_TABLET | Freq: Every day | ORAL | 1 refills | Status: DC | PRN

## 2020-06-22 NOTE — Telephone Encounter (Signed)
Spoke to pt told him Rx's were sent to pharmacy and to make sure you keep your appt in Sept. Pt verbalized understanding. Rx's sent.

## 2020-06-22 NOTE — Telephone Encounter (Signed)
Left message on voicemail to call office. Need to clarify how much Zoloft he is taking?

## 2020-06-22 NOTE — Telephone Encounter (Signed)
Zoloft, patient is taking half a tablet. 25MG . Looked at his bottle and states he has more than what he thought and no longer needs refill

## 2020-06-22 NOTE — Telephone Encounter (Addendum)
  LAST APPOINTMENT DATE: 2/252022   NEXT APPOINTMENT DATE: 09/26/2020  MEDICATION:buPROPion (WELLBUTRIN XL) 150 MG 24 hr tablet // losartan (COZAAR) 50 MG tablet // sertraline (ZOLOFT) 50 MG tablet // traZODone (DESYREL) 50 MG tablet// tadalafil (CIALIS) 10 MG tablet   PHARMACY: [facility] [address] [place] - [address]  Comments: Patient is completley out of Wellbutrin, and Losartan.    Please advise

## 2020-09-24 ENCOUNTER — Other Ambulatory Visit: Payer: Self-pay | Admitting: Family Medicine

## 2020-09-26 ENCOUNTER — Encounter: Payer: Self-pay | Admitting: Family Medicine

## 2020-09-26 ENCOUNTER — Ambulatory Visit (INDEPENDENT_AMBULATORY_CARE_PROVIDER_SITE_OTHER): Payer: No Typology Code available for payment source | Admitting: Family Medicine

## 2020-09-26 ENCOUNTER — Other Ambulatory Visit: Payer: Self-pay

## 2020-09-26 VITALS — BP 108/69 | HR 64 | Temp 97.2°F | Ht 69.0 in | Wt 220.0 lb

## 2020-09-26 DIAGNOSIS — Z6832 Body mass index (BMI) 32.0-32.9, adult: Secondary | ICD-10-CM

## 2020-09-26 DIAGNOSIS — E669 Obesity, unspecified: Secondary | ICD-10-CM

## 2020-09-26 DIAGNOSIS — Z0001 Encounter for general adult medical examination with abnormal findings: Secondary | ICD-10-CM | POA: Diagnosis not present

## 2020-09-26 DIAGNOSIS — Z23 Encounter for immunization: Secondary | ICD-10-CM | POA: Diagnosis not present

## 2020-09-26 DIAGNOSIS — I10 Essential (primary) hypertension: Secondary | ICD-10-CM | POA: Diagnosis not present

## 2020-09-26 DIAGNOSIS — G4700 Insomnia, unspecified: Secondary | ICD-10-CM

## 2020-09-26 DIAGNOSIS — E785 Hyperlipidemia, unspecified: Secondary | ICD-10-CM

## 2020-09-26 DIAGNOSIS — F331 Major depressive disorder, recurrent, moderate: Secondary | ICD-10-CM

## 2020-09-26 DIAGNOSIS — F419 Anxiety disorder, unspecified: Secondary | ICD-10-CM

## 2020-09-26 MED ORDER — SEMAGLUTIDE (1 MG/DOSE) 4 MG/3ML ~~LOC~~ SOPN: 1.0000 mg | PEN_INJECTOR | SUBCUTANEOUS | 3 refills | Status: DC

## 2020-09-26 MED ORDER — TRAZODONE HCL 50 MG PO TABS: ORAL_TABLET | ORAL | 2 refills | Status: DC

## 2020-09-26 NOTE — Assessment & Plan Note (Signed)
Last LDL 107.  He will continue working on weight loss.  We can recheck again next year.

## 2020-09-26 NOTE — Assessment & Plan Note (Signed)
Continue Zoloft 50 mg daily 

## 2020-09-26 NOTE — Assessment & Plan Note (Signed)
Continue Wellbutrin 150 mg daily and Zoloft 50 mg daily.

## 2020-09-26 NOTE — Patient Instructions (Signed)
It was very nice to see you today!  We will get your flu and shingles vaccine today.  Please increase your dose of Ozempic to 1 mg weekly.  Keep an eye on your blood pressure and let me know if it starts running too low.  I will see back in year for your next annual physical.  We can check blood work at that time.  Please come back to see me sooner if needed.  Take care, Dr Jerline Pain  PLEASE NOTE:  If you had any lab tests please let us know if you have not heard back within a few days. You may see your results on mychart before we have a chance to review them but we will give you a call once they are reviewed by [place]. If we ordered any referrals today, please let us know if you have not heard from their office within the next week.   Please try these tips to maintain a healthy lifestyle:  Eat at least 3 REAL meals and 1-2 snacks per day.  Aim for no more than 5 hours between eating.  If you eat breakfast, please do so within one hour of getting up.   Each meal should contain half fruits/vegetables, one quarter protein, and one quarter carbs (no bigger than a computer mouse)  Cut down on sweet beverages. This includes juice, soda, and sweet tea.   Drink at least 1 glass of water with each meal and aim for at least 8 glasses per day  Exercise at least 150 minutes every week.    Preventive Care 65-28 Years Old, Male Preventive care refers to lifestyle choices and visits with your health care provider that can promote health and wellness. This includes: A yearly physical exam. This is also called an annual wellness visit. Regular dental and eye exams. Immunizations. Screening for certain conditions. Healthy lifestyle choices, such as: Eating a healthy diet. Getting regular exercise. Not using drugs or products that contain nicotine and tobacco. Limiting alcohol use. What can I expect for my preventive care visit? Physical exam Your health care provider will check your: Height and  weight. These may be used to calculate your BMI (body mass index). BMI is a measurement that tells if you are at a healthy weight. Heart rate and blood pressure. Body temperature. Skin for abnormal spots. Counseling Your health care provider may ask you questions about your: Past medical problems. Family's medical history. Alcohol, tobacco, and drug use. Emotional well-being. Home life and relationship well-being. Sexual activity. Diet, exercise, and sleep habits. Work and work Statistician. Access to firearms. What immunizations do I need? Vaccines are usually given at various ages, according to a schedule. Your health care provider will recommend vaccines for you based on your age, medical history, and lifestyle or other factors, such as travel or where you work. What tests do I need? Blood tests Lipid and cholesterol levels. These may be checked every 5 years, or more often if you are over 70 years old. Hepatitis C test. Hepatitis B test. Screening Lung cancer screening. You may have this screening every year starting at age 35 if you have a 30-pack-year history of smoking and currently smoke or have quit within the past 15 years. Prostate cancer screening. Recommendations will vary depending on your family history and other risks. Genital exam to check for testicular cancer or hernias. Colorectal cancer screening. All adults should have this screening starting at age 59 and continuing until age 45. Your health care provider  may recommend screening at age 49 if you are at increased risk. You will have tests every 1-10 years, depending on your results and the type of screening test. Diabetes screening. This is done by checking your blood sugar (glucose) after you have not eaten for a while (fasting). You may have this done every 1-3 years. STD (sexually transmitted disease) testing, if you are at risk. Follow these instructions at home: Eating and drinking  Eat a diet that  includes fresh fruits and vegetables, whole grains, lean protein, and low-fat dairy products. Take vitamin and mineral supplements as recommended by your health care provider. Do not drink alcohol if your health care provider tells you not to drink. If you drink alcohol: Limit how much you have to 0-2 drinks a day. Be aware of how much alcohol is in your drink. In the [place] one drink equals one 12 oz bottle of beer (355 mL), one 5 oz glass of wine (148 mL), or one 1 oz glass of hard liquor (44 mL). Lifestyle Take daily care of your teeth and gums. Brush your teeth every morning and night with fluoride toothpaste. Floss one time each day. Stay active. Exercise for at least 30 minutes 5 or more days each week. Do not use any products that contain nicotine or tobacco, such as cigarettes, e-cigarettes, and chewing tobacco. If you need help quitting, ask your health care provider. Do not use drugs. If you are sexually active, practice safe sex. Use a condom or other form of protection to prevent STIs (sexually transmitted infections). If told by your health care provider, take low-dose aspirin daily starting at age 47. Find healthy ways to cope with stress, such as: Meditation, yoga, or listening to music. Journaling. Talking to a trusted person. Spending time with friends and family. Safety Always wear your seat belt while driving or riding in a vehicle. Do not drive: If you have been drinking alcohol. Do not ride with someone who has been drinking. When you are tired or distracted. While texting. Wear a helmet and other protective equipment during sports activities. If you have firearms in your house, make sure you follow all gun safety procedures. What's next? Go to your health care provider once a year for an annual wellness visit. Ask your health care provider how often you should have your eyes and teeth checked. Stay up to date on all vaccines. This information is not intended to  replace advice given to you by your health care provider. Make sure you discuss any questions you have with your health care provider. Document Revised: 03/09/2020 Document Reviewed: 12/24/2017 Elsevier Patient Education  2022 [employer].

## 2020-09-26 NOTE — Assessment & Plan Note (Signed)
On losartan 100 mg daily.  Blood pressure is on lower side today but he is not having any major symptoms.  Advised patient to continue monitoring at home and let us know if he has any symptomatic lows.  Anticipate we will likely need to decrease or discontinue blood pressure medications as he continues to lose weight.

## 2020-09-26 NOTE — Assessment & Plan Note (Signed)
Refill trazodone today.  He also continue melatonin.  He will let me know if this continues to be a problem.

## 2020-09-26 NOTE — Assessment & Plan Note (Signed)
Doing well with Ozempic.  Will increase dose to 1 mg weekly.  He is working on lifestyle modifications.  He is down about 20 pounds over the last 6 months.  Follow-up again in 6 to 12 months.

## 2020-09-26 NOTE — Progress Notes (Signed)
Chief Complaint:  Noah Gonzalez is a 50 y.o. male who presents today for his annual comprehensive physical exam.    Assessment/Plan:  Chronic Problems Addressed Today: Dyslipidemia Last LDL 107.  He will continue working on weight loss.  We can recheck again next year.  Obesity Doing well with Ozempic.  Will increase dose to 1 mg weekly.  He is working on lifestyle modifications.  He is down about 20 pounds over the last 6 months.  Follow-up again in 6 to 12 months.  Insomnia Refill trazodone today.  He also continue melatonin.  He will let me know if this continues to be a problem.  Anxiety Continue Zoloft 50 mg daily.  Major depressive disorder, recurrent episode, moderate (HCC) Continue Wellbutrin 150 mg daily and Zoloft 50 mg daily.  Essential hypertension On losartan 100 mg daily.  Blood pressure is on lower side today but he is not having any major symptoms.  Advised patient to continue monitoring at home and let us know if he has any symptomatic lows.  Anticipate we will likely need to decrease or discontinue blood pressure medications as he continues to lose weight.   Body mass index is 32.49 kg/m. / Obese  BMI Metric Follow Up - 09/26/20 0856       BMI Metric Follow Up-Please document annually   BMI Metric Follow Up Education provided              Preventative Healthcare: Flu and shingles vaccines given today.   Patient Counseling(The following topics were reviewed and/or handout was given):  -Nutrition: Stressed importance of moderation in sodium/caffeine intake, saturated fat and cholesterol, caloric balance, sufficient intake of fresh fruits, vegetables, and fiber.  -Stressed the importance of regular exercise.   -Substance Abuse: Discussed cessation/primary prevention of tobacco, alcohol, or other drug use; driving or other dangerous activities under the influence; availability of treatment for abuse.   -Injury prevention: Discussed safety belts,  safety helmets, smoke detector, smoking near bedding or upholstery.   -Sexuality: Discussed sexually transmitted diseases, partner selection, use of condoms, avoidance of unintended pregnancy and contraceptive alternatives.   -Dental health: Discussed importance of regular tooth brushing, flossing, and dental visits.  -Health maintenance and immunizations reviewed. Please refer to Health maintenance section.  Return to care in 1 year for next preventative visit.     Subjective:  HPI:  He has no acute complaints today.   The Ozempic has been working well for him in reducing sugar craving, which has helped in his weight loss. He is wondering if there is a stronger dose, as he still has some sugar cravings a few times a week.  Is compliant with all medication. Taking Trazodone he wakes up around 4am, and this also occurs while taking melatonin.  Lifestyle Diet: Reasonably healthy diet, can still reduce sugar intake. Exercise: Weightlifting, mountain biking  Depression screen [facility] 2/9 09/26/2020  Decreased Interest 0  Down, Depressed, Hopeless 0  PHQ - 2 Score 0  Altered sleeping 1  Tired, decreased energy 0  Change in appetite 0  Feeling bad or failure about yourself  0  Trouble concentrating 0  Moving slowly or fidgety/restless 0  Suicidal thoughts 0  PHQ-9 Score 1  Difficult doing work/chores Not difficult at all    Health Maintenance Due  Topic Date Due   Pneumococcal Vaccine 77-26 Years old (1 - PCV) Never done   HIV Screening  Never done   Hepatitis C Screening  Never done  Fecal DNA (Cologuard)  Never done     ROS: Per HPI, otherwise a complete review of systems was negative.   PMH:  The following were reviewed and entered/updated in epic: Past Medical History:  Diagnosis Date   Arthritis    osteoarthritis-hips   Cancer ([place]) 11/13/2017   prostate cancer   GERD (gastroesophageal reflux disease)    12/26/15- states has resolved   Hypertension    history of-  has lost 40 lbs   Low serum testosterone level    recent tx. 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   Past Surgical History:  Procedure Laterality Date   PROSTATECTOMY Bilateral 03/14/2018   TOTAL HIP ARTHROPLASTY Right 01/21/2013   Procedure: RIGHT TOTAL HIP ARTHROPLASTY ANTERIOR APPROACH;  Surgeon: Mcarthur Rossetti, MD;  Location: [facility];  Service: Orthopedics;  Laterality: Right;   TOTAL HIP ARTHROPLASTY Left 01/04/2016   Procedure: LEFT TOTAL HIP ARTHROPLASTY ANTERIOR APPROACH;  Surgeon: Mcarthur Rossetti, MD;  Location: [facility];  Service: Orthopedics;  Laterality: Left;   WISDOM TOOTH EXTRACTION      Family History  Problem Relation Age of Onset   Pancreatic cancer Mother    Lung cancer Maternal Grandfather    Prostate cancer Paternal Grandfather     Medications- reviewed and updated Current Outpatient Medications  Medication Sig Dispense Refill   buPROPion (WELLBUTRIN XL) 150 MG 24 hr tablet Take 1 tablet (150 mg total) by mouth daily. 90 tablet 1   losartan (COZAAR) 50 MG tablet Take 2 tablets (100 mg total) by mouth daily. 180 tablet 1   Melatonin 10 MG CAPS Take by mouth.     Semaglutide, 1 MG/DOSE, 4 MG/3ML SOPN Inject 1 mg as directed once a week. 9 mL 3   sertraline (ZOLOFT) 50 MG tablet TAKE 1 TABLET BY MOUTH ONCE DAILY FOR 7 DAYS, THEN 2 TABLETS DAILY FOR 7 DAYS,THEN 3 TABLETS DAILY . APPOINTMENT REQUIRED FOR FUTURE REFILLS 90 tablet 0   tadalafil (CIALIS) 10 MG  tablet Take 1 tablet (10 mg total) by mouth daily as needed for erectile dysfunction. 90 tablet 1   testosterone cypionate (DEPOTESTOSTERONE CYPIONATE) 200 MG/ML injection Inject into the muscle every 14 (fourteen) days.     traZODone (DESYREL) 50 MG tablet TAKE HALF TO ONE TABLET BY MOUTH AT BEDTIME AS NEEDED FOR SLEEP 30 tablet 2   No current facility-administered medications for this visit.    Allergies-reviewed and updated Allergies  Allergen Reactions   Hydrocodone Nausea And Vomiting    Headache    Oxycodone Hcl Other (See Comments)    Causes severe headache    Social History   Socioeconomic History   Marital status: Married    Spouse name: Not on file   Number of children: 0   Years of education: Not on file   Highest education level: Not on file  Occupational History    Comment: [employer]  Tobacco Use   Smoking status: Former    Years: 2.00    Types: Cigarettes  Quit date: 01/17/2005    Years since quitting: 15.7   Smokeless tobacco: Former    Types: Chew   Tobacco comments:    quit 6 yrs ago  Substance and Sexual Activity   Alcohol use: No   Drug use: No   Sexual activity: Yes  Other Topics Concern   Not on file  Social History Narrative   Not on file   Social Determinants of Health   Financial Resource Strain: Not on file  Food Insecurity: Not on file  Transportation Needs: Not on file  Physical Activity: Not on file  Stress: Not on file  Social Connections: Not on file        Objective:  Physical Exam: BP 108/69   Pulse 64   Temp (!) 97.2 F (36.2 C) (Temporal)   Ht '5\' 9"'$  (1.753 m)   Wt 220 lb (99.8 kg)   SpO2 94%   BMI 32.49 kg/m   Body mass index is 32.49 kg/m. Wt Readings from Last 3 Encounters:  09/26/20 220 lb (99.8 kg)  03/09/20 239 lb 3.2 oz (108.5 kg)  10/20/19 238 lb 12.8 oz (108.3 kg)   Gen: NAD, resting comfortably HEENT: TMs normal bilaterally. OP clear. No thyromegaly noted.  CV: RRR with no murmurs  appreciated Pulm: NWOB, CTAB with no crackles, wheezes, or rhonchi GI: Normal bowel sounds present. Soft, Nontender, Nondistended. MSK: no edema, cyanosis, or clubbing noted Skin: warm, dry Neuro: CN2-12 grossly intact. Strength 5/5 in upper and lower extremities. Reflexes symmetric and intact bilaterally.  Psych: Normal affect and thought content     I,Jordan Kelly,acting as a scribe for Dimas Chyle, MD.,have documented all relevant documentation on the behalf of Dimas Chyle, MD,as directed by  Dimas Chyle, MD while in the presence of Dimas Chyle, MD.  I, Dimas Chyle, MD, have reviewed all documentation for this visit. The documentation on 09/26/20 for the exam, diagnosis, procedures, and orders are all accurate and complete.  Algis Greenhouse. Jerline Pain, MD 09/26/2020 8:57 AM

## 2020-11-20 ENCOUNTER — Telehealth: Payer: Self-pay

## 2020-11-20 NOTE — Telephone Encounter (Signed)
Left message to call pharmacy for refills  Should have refills left at your pharmacy

## 2020-11-20 NOTE — Telephone Encounter (Signed)
MEDICATION: sertraline (ZOLOFT) 50 MG tablet   [place], [place] [address] Phone:  [phone]  Fax:  [phone]      Comments: Patient is completely out.  **Let patient know to contact pharmacy at the end of the day to make sure medication is ready. **  ** Please notify patient to allow 48-72 hours to process**  **Encourage patient to contact the pharmacy for refills or they can request refills through [facility]**

## 2020-11-21 NOTE — Telephone Encounter (Signed)
Patient called back in and stated the pharmacy has not refills for the patient.

## 2020-11-22 ENCOUNTER — Other Ambulatory Visit: Payer: Self-pay

## 2020-11-22 MED ORDER — SERTRALINE HCL 50 MG PO TABS: ORAL_TABLET | ORAL | 0 refills | Status: DC

## 2020-11-22 NOTE — Telephone Encounter (Signed)
Rx sent in

## 2020-11-27 ENCOUNTER — Other Ambulatory Visit: Payer: Self-pay

## 2020-11-27 ENCOUNTER — Ambulatory Visit (INDEPENDENT_AMBULATORY_CARE_PROVIDER_SITE_OTHER): Payer: No Typology Code available for payment source

## 2020-11-27 DIAGNOSIS — Z23 Encounter for immunization: Secondary | ICD-10-CM | POA: Diagnosis not present

## 2020-12-04 ENCOUNTER — Other Ambulatory Visit: Payer: Self-pay | Admitting: *Deleted

## 2020-12-04 MED ORDER — TRAZODONE HCL 50 MG PO TABS: ORAL_TABLET | ORAL | 2 refills | Status: DC

## 2020-12-14 ENCOUNTER — Other Ambulatory Visit: Payer: Self-pay | Admitting: Family Medicine

## 2021-01-03 ENCOUNTER — Other Ambulatory Visit: Payer: Self-pay | Admitting: *Deleted

## 2021-01-03 ENCOUNTER — Other Ambulatory Visit: Payer: Self-pay | Admitting: Family Medicine

## 2021-01-03 MED ORDER — TADALAFIL 10 MG PO TABS: 10.0000 mg | ORAL_TABLET | Freq: Every day | ORAL | 1 refills | Status: DC | PRN

## 2021-01-03 NOTE — Telephone Encounter (Signed)
Spoke to pt asked him how much Zoloft he was taking received request from pharmacy for refill. Pt said he does not need refill was sent in last month. Told him also received request for Ozempic 0.25 but that was discontinued. Rx was sent for Ozempic 1 mg. Pt said yes, I will call them and let them know what I need,but I do need refill for Cialis. Told pt okay will send Rx to pharmacy. Pt verbalized understanding.

## 2021-02-16 ENCOUNTER — Other Ambulatory Visit: Payer: Self-pay | Admitting: Family Medicine

## 2021-02-19 ENCOUNTER — Other Ambulatory Visit: Payer: Self-pay | Admitting: Family Medicine

## 2021-03-15 ENCOUNTER — Other Ambulatory Visit: Payer: Self-pay | Admitting: Family Medicine

## 2021-04-02 ENCOUNTER — Other Ambulatory Visit: Payer: Self-pay | Admitting: Family Medicine

## 2021-04-20 ENCOUNTER — Other Ambulatory Visit: Payer: Self-pay | Admitting: Family Medicine

## 2021-04-27 ENCOUNTER — Other Ambulatory Visit: Payer: Self-pay | Admitting: Family Medicine

## 2021-04-29 NOTE — Telephone Encounter (Signed)
Last visit: 09/26/20 ? ?Next visit: none scheduled ? ?Last filled: 04/03/21 ? ?Quantity: 30  ?

## 2021-05-25 ENCOUNTER — Other Ambulatory Visit: Payer: Self-pay | Admitting: Family Medicine

## 2021-06-28 ENCOUNTER — Other Ambulatory Visit: Payer: Self-pay | Admitting: Family Medicine

## 2021-07-18 ENCOUNTER — Other Ambulatory Visit: Payer: Self-pay | Admitting: Family Medicine

## 2021-08-15 ENCOUNTER — Other Ambulatory Visit: Payer: Self-pay | Admitting: Family Medicine

## 2021-09-17 ENCOUNTER — Other Ambulatory Visit: Payer: Self-pay | Admitting: Family Medicine

## 2021-10-07 ENCOUNTER — Encounter: Payer: Self-pay | Admitting: *Deleted

## 2021-10-28 ENCOUNTER — Other Ambulatory Visit: Payer: Self-pay | Admitting: Family Medicine

## 2021-11-05 ENCOUNTER — Telehealth: Payer: Self-pay | Admitting: Family Medicine

## 2021-11-05 NOTE — Telephone Encounter (Signed)
Patient scheduled for CPE & Med check 11/12/21

## 2021-11-05 NOTE — Telephone Encounter (Signed)
Pt's refill was recently denied. Pt has not been seen in a year. Need to schedule an OV for a med check & physical

## 2021-11-12 ENCOUNTER — Encounter: Payer: No Typology Code available for payment source | Admitting: Family Medicine

## 2021-11-12 ENCOUNTER — Ambulatory Visit (INDEPENDENT_AMBULATORY_CARE_PROVIDER_SITE_OTHER): Payer: No Typology Code available for payment source | Admitting: Family Medicine

## 2021-11-12 ENCOUNTER — Encounter: Payer: Self-pay | Admitting: Family Medicine

## 2021-11-12 VITALS — BP 144/87 | HR 59 | Temp 97.3°F | Ht 69.0 in | Wt 216.0 lb

## 2021-11-12 DIAGNOSIS — F419 Anxiety disorder, unspecified: Secondary | ICD-10-CM

## 2021-11-12 DIAGNOSIS — Z23 Encounter for immunization: Secondary | ICD-10-CM | POA: Diagnosis not present

## 2021-11-12 DIAGNOSIS — Z0001 Encounter for general adult medical examination with abnormal findings: Secondary | ICD-10-CM | POA: Diagnosis not present

## 2021-11-12 DIAGNOSIS — G4700 Insomnia, unspecified: Secondary | ICD-10-CM

## 2021-11-12 DIAGNOSIS — E785 Hyperlipidemia, unspecified: Secondary | ICD-10-CM

## 2021-11-12 DIAGNOSIS — E669 Obesity, unspecified: Secondary | ICD-10-CM | POA: Diagnosis not present

## 2021-11-12 DIAGNOSIS — Z1211 Encounter for screening for malignant neoplasm of colon: Secondary | ICD-10-CM

## 2021-11-12 DIAGNOSIS — C61 Malignant neoplasm of prostate: Secondary | ICD-10-CM

## 2021-11-12 DIAGNOSIS — R739 Hyperglycemia, unspecified: Secondary | ICD-10-CM

## 2021-11-12 DIAGNOSIS — F331 Major depressive disorder, recurrent, moderate: Secondary | ICD-10-CM

## 2021-11-12 DIAGNOSIS — I10 Essential (primary) hypertension: Secondary | ICD-10-CM | POA: Diagnosis not present

## 2021-11-12 MED ORDER — SERTRALINE HCL 50 MG PO TABS: 150.0000 mg | ORAL_TABLET | Freq: Every day | ORAL | 3 refills | Status: DC

## 2021-11-12 MED ORDER — TRAZODONE HCL 50 MG PO TABS: 25.0000 mg | ORAL_TABLET | Freq: Every evening | ORAL | 3 refills | Status: DC | PRN

## 2021-11-12 MED ORDER — BUPROPION HCL ER (XL) 150 MG PO TB24: 150.0000 mg | ORAL_TABLET | Freq: Every day | ORAL | 0 refills | Status: DC

## 2021-11-12 MED ORDER — SEMAGLUTIDE (1 MG/DOSE) 4 MG/3ML ~~LOC~~ SOPN: 1.0000 mg | PEN_INJECTOR | SUBCUTANEOUS | 0 refills | Status: DC

## 2021-11-12 MED ORDER — TADALAFIL 10 MG PO TABS: 10.0000 mg | ORAL_TABLET | Freq: Every day | ORAL | 0 refills | Status: DC | PRN

## 2021-11-12 MED ORDER — SEMAGLUTIDE (2 MG/DOSE) 8 MG/3ML ~~LOC~~ SOPN: 2.0000 mg | PEN_INJECTOR | SUBCUTANEOUS | 0 refills | Status: DC

## 2021-11-12 MED ORDER — LOSARTAN POTASSIUM 50 MG PO TABS: 100.0000 mg | ORAL_TABLET | Freq: Every day | ORAL | 1 refills | Status: DC

## 2021-11-12 MED ORDER — OZEMPIC (0.25 OR 0.5 MG/DOSE) 2 MG/3ML ~~LOC~~ SOPN: 0.2500 mg | PEN_INJECTOR | SUBCUTANEOUS | 0 refills | Status: DC

## 2021-11-12 NOTE — Assessment & Plan Note (Signed)
Stable on trazodone 25 to 50 mg nightly as needed. ?

## 2021-11-12 NOTE — Assessment & Plan Note (Signed)
Stable. No signs of recurrence.  

## 2021-11-12 NOTE — Progress Notes (Signed)
Chief Complaint:  Noah Gonzalez is a 51 y.o. male who presents today for his annual comprehensive physical exam.    Assessment/Plan:  Chronic Problems Addressed Today: Obesity BMI 31.9 today.  Continue lifestyle modifications.  He is doing well with Ozempic however ran out of medication over the last several weeks.  We will restart.  He will write back up to 2 mg weekly over the next couple of months.  He will follow-up in a couple weeks via [place].  Dyslipidemia Check lipids.  He is working on weight loss.  Insomnia Stable on trazodone 25 to 50 mg nightly as needed.  Anxiety Stable on Zoloft 150 mg daily.  Will refill today.  Major depressive disorder, recurrent episode, moderate (HCC) Overall well controlled though he has been off of Wellbutrin for a few weeks.  He would like to restart this as this also helps with his current cravings.  Will refill today 150 mg daily continue Zoloft 150 mg daily.  Essential hypertension Slightly elevated though was at goal last year.  His home readings are typically in the 130s to 140s over 80s.  Would like to avoid adding additional blood pressure meds at this point.  He will continue to monitor at home.  We are working on weight loss as above.  If persistently elevated over the next several weeks would consider addition of amlodipine or HCTZ.  Prostate cancer [facility]) s/p prostatectomy 2020 Stable.  No signs of recurrence.  Preventative Healthcare: Check labs today.  Cologuard ordered.  Flu shot given today.  Patient Counseling(The following topics were reviewed and/or handout was given):  -Nutrition: Stressed importance of moderation in sodium/caffeine intake, saturated fat and cholesterol, caloric balance, sufficient intake of fresh fruits, vegetables, and fiber.  -Stressed the importance of regular exercise.   -Substance Abuse: Discussed cessation/primary prevention of tobacco, alcohol, or other drug use; driving or other dangerous  activities under the influence; availability of treatment for abuse.   -Injury prevention: Discussed safety belts, safety helmets, smoke detector, smoking near bedding or upholstery.   -Sexuality: Discussed sexually transmitted diseases, partner selection, use of condoms, avoidance of unintended pregnancy and contraceptive alternatives.   -Dental health: Discussed importance of regular tooth brushing, flossing, and dental visits.  -Health maintenance and immunizations reviewed. Please refer to Health maintenance section.  Return to care in 1 year for next preventative visit.     Subjective:  HPI:  He has no acute complaints today.  See A/P for status of chronic conditions.     11/12/2021    2:56 PM  Depression screen PHQ 2/9  Decreased Interest 0  Down, Depressed, Hopeless 0  PHQ - 2 Score 0  Altered sleeping 0  Tired, decreased energy 0  Change in appetite 2  Feeling bad or failure about yourself  0  Trouble concentrating 0  Moving slowly or fidgety/restless 0  Suicidal thoughts 0  PHQ-9 Score 2  Difficult doing work/chores Not difficult at all    Health Maintenance Due  Topic Date Due   HIV Screening  Never done   Hepatitis C Screening  Never done   Fecal DNA (Cologuard)  Never done     ROS: Per HPI, otherwise a complete review of systems was negative.   PMH:  The following were reviewed and entered/updated in epic: Past Medical History:  Diagnosis Date   Arthritis    osteoarthritis-hips   Cancer ([place]) 11/13/2017   prostate cancer   GERD (gastroesophageal reflux disease)    12/26/15-  states has resolved   Hypertension    history of- has lost 40 lbs   Low serum testosterone level    recent tx. 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/03/2014   Past Surgical History:  Procedure Laterality Date   PROSTATECTOMY Bilateral 03/14/2018   TOTAL HIP ARTHROPLASTY Right 01/21/2013   Procedure: RIGHT TOTAL HIP ARTHROPLASTY ANTERIOR APPROACH;  Surgeon: Mcarthur Rossetti, MD;  [facility];  Service: Orthopedics;  Laterality: Right;   TOTAL HIP ARTHROPLASTY Left 01/04/2016   Procedure: LEFT TOTAL HIP ARTHROPLASTY ANTERIOR APPROACH;  Surgeon: Mcarthur Rossetti, MD;  Location: [facility];  Service: Orthopedics;  Laterality: Left;   WISDOM TOOTH EXTRACTION      Family History  Problem Relation Age of Onset   Pancreatic cancer Mother    Lung cancer Maternal Grandfather    Prostate cancer Paternal Grandfather     Medications- reviewed and updated Current Outpatient Medications  Medication Sig Dispense Refill   Melatonin 10 MG CAPS Take by mouth.     Semaglutide, 1 MG/DOSE, 4 MG/3ML SOPN Inject 1 mg into the skin once a week. 3 mL 0   Semaglutide, 2 MG/DOSE, 8 MG/3ML SOPN Inject 2 mg as directed once a week. 9 mL 0   Semaglutide,0.25 or 0.'5MG'$ /DOS, (OZEMPIC, 0.25 OR 0.5 MG/DOSE,) 2 MG/3ML SOPN Inject 0.25-0.5 mg into the skin once a week. 3 mL 0   testosterone cypionate (DEPOTESTOSTERONE CYPIONATE) 200 MG/ML injection Inject into the muscle every 14 (fourteen) days.     buPROPion (WELLBUTRIN XL) 150 MG 24 hr tablet Take 1 tablet (150 mg total) by mouth daily. 90 tablet 0   losartan (COZAAR) 50 MG tablet Take 2 tablets (100 mg total) by mouth daily. 180 tablet 1   sertraline (ZOLOFT) 50 MG tablet Take 3 tablets (150 mg total) by mouth daily. 270 tablet 3   tadalafil (CIALIS) 10 MG tablet Take 1 tablet (10 mg total) by mouth daily as needed for erectile dysfunction.  90 tablet 0   traZODone (DESYREL) 50 MG tablet Take 0.5-1 tablets (25-50 mg total) by mouth at bedtime as needed for sleep. 90 tablet 3   No current facility-administered medications for this visit.    Allergies-reviewed and updated Allergies  Allergen Reactions   Hydrocodone Nausea And Vomiting    Headache    Oxycodone Hcl Other (See Comments)    Causes severe headache    Social History   Socioeconomic History   Marital status: Married    Spouse name: Not on file   Number of children: 0   Years of education: Not on file   Highest education level: Not on file  Occupational History    Comment: [employer]  Tobacco Use   Smoking status: Former    Years: 2.00    Types: Cigarettes    Quit date: 01/17/2005    Years since quitting: 16.8   Smokeless tobacco: Former    Types: Chew   Tobacco comments:    quit 6 yrs ago  Substance and Sexual  Activity   Alcohol use: No   Drug use: No   Sexual activity: Yes  Other Topics Concern   Not on file  Social History Narrative   Not on file   Social Determinants of Health   Financial Resource Strain: Not on file  Food Insecurity: Not on file  Transportation Needs: Not on file  Physical Activity: Not on file  Stress: Not on file  Social Connections: Not on file        Objective:  Physical Exam: BP (!) 144/87   Pulse (!) 59   Temp (!) 97.3 F (36.3 C) (Temporal)   Ht '5\' 9"'$  (1.753 m)   Wt 216 lb (98 kg)   SpO2 96%   BMI 31.90 kg/m   Body mass index is 31.9 kg/m. Wt Readings from Last 3 Encounters:  11/12/21 216 lb (98 kg)  09/26/20 220 lb (99.8 kg)  03/09/20 239 lb 3.2 oz (108.5 kg)   Gen: NAD, resting comfortably HEENT: TMs normal bilaterally. OP clear. No thyromegaly noted.  CV: RRR with no murmurs appreciated Pulm: NWOB, CTAB with no crackles, wheezes, or rhonchi GI: Normal bowel sounds present. Soft, Nontender, Nondistended. MSK: no edema, cyanosis, or clubbing noted Skin: warm, dry Neuro: CN2-12  grossly intact. Strength 5/5 in upper and lower extremities. Reflexes symmetric and intact bilaterally.  Psych: Normal affect and thought content     Jazyah Butsch M. Jerline Pain, MD 11/12/2021 2:59 PM

## 2021-11-12 NOTE — Assessment & Plan Note (Signed)
Slightly elevated though was at goal last year.  His home readings are typically in the 130s to 140s over 80s.  Would like to avoid adding additional blood pressure meds at this point.  He will continue to monitor at home.  We are working on weight loss as above.  If persistently elevated over the next several weeks would consider addition of amlodipine or HCTZ.

## 2021-11-12 NOTE — Assessment & Plan Note (Signed)
Stable on Zoloft 150 mg daily.  Will refill today.

## 2021-11-12 NOTE — Assessment & Plan Note (Signed)
Overall well controlled though he has been off of Wellbutrin for a few weeks.  He would like to restart this as this also helps with his current cravings.  Will refill today 150 mg daily continue Zoloft 150 mg daily.

## 2021-11-12 NOTE — Patient Instructions (Signed)
It was very nice to see you today!  We gave your flu shot today.  We will refill your medications.  We will restart your Ozempic.  Please start 0.25 mg weekly for 4 weeks then increase to 0.5 mg weekly for 4 weeks then increase to 1 mg weekly for 4 weeks then increase to 2 mg weekly for 4 weeks.  Please keep an eye on your blood pressure and let me know if it is persistently elevated.  Please continue to work on diet and exercise.  We will see back in year for your next physical.  Come back sooner if needed.  Take care, Dr Jerline Pain  PLEASE NOTE:  If you had any lab tests please let us know if you have not heard back within a few days. You may see your results on mychart before we have a chance to review them but we will give you a call once they are reviewed by [place]. If we ordered any referrals today, please let us know if you have not heard from their office within the next week.   Please try these tips to maintain a healthy lifestyle:  Eat at least 3 REAL meals and 1-2 snacks per day.  Aim for no more than 5 hours between eating.  If you eat breakfast, please do so within one hour of getting up.   Each meal should contain half fruits/vegetables, one quarter protein, and one quarter carbs (no bigger than a computer mouse)  Cut down on sweet beverages. This includes juice, soda, and sweet tea.   Drink at least 1 glass of water with each meal and aim for at least 8 glasses per day  Exercise at least 150 minutes every week.     Preventive Care 35-90 Years Old, Male Preventive care refers to lifestyle choices and visits with your health care provider that can promote health and wellness. Preventive care visits are also called wellness exams. What can I expect for my preventive care visit? Counseling During your preventive care visit, your health care provider may ask about your: Medical history, including: Past medical problems. Family medical history. Current health,  including: Emotional well-being. Home life and relationship well-being. Sexual activity. Lifestyle, including: Alcohol, nicotine or tobacco, and drug use. Access to firearms. Diet, exercise, and sleep habits. Safety issues such as seatbelt and bike helmet use. Sunscreen use. Work and work Statistician. Physical exam Your health care provider will check your: Height and weight. These may be used to calculate your BMI (body mass index). BMI is a measurement that tells if you are at a healthy weight. Waist circumference. This measures the distance around your waistline. This measurement also tells if you are at a healthy weight and may help predict your risk of certain diseases, such as type 2 diabetes and high blood pressure. Heart rate and blood pressure. Body temperature. Skin for abnormal spots. What immunizations do I need?  Vaccines are usually given at various ages, according to a schedule. Your health care provider will recommend vaccines for you based on your age, medical history, and lifestyle or other factors, such as travel or where you work. What tests do I need? Screening Your health care provider may recommend screening tests for certain conditions. This may include: Lipid and cholesterol levels. Diabetes screening. This is done by checking your blood sugar (glucose) after you have not eaten for a while (fasting). Hepatitis B test. Hepatitis C test. HIV (human immunodeficiency virus) test. STI (sexually transmitted infection) testing, if  you are at risk. Lung cancer screening. Prostate cancer screening. Colorectal cancer screening. Talk with your health care provider about your test results, treatment options, and if necessary, the need for more tests. Follow these instructions at home: Eating and drinking  Eat a diet that includes fresh fruits and vegetables, whole grains, lean protein, and low-fat dairy products. Take vitamin and mineral supplements as recommended  by your health care provider. Do not drink alcohol if your health care provider tells you not to drink. If you drink alcohol: Limit how much you have to 0-2 drinks a day. Know how much alcohol is in your drink. In the [place] one drink equals one 12 oz bottle of beer (355 mL), one 5 oz glass of wine (148 mL), or one 1 oz glass of hard liquor (44 mL). Lifestyle Brush your teeth every morning and night with fluoride toothpaste. Floss one time each day. Exercise for at least 30 minutes 5 or more days each week. Do not use any products that contain nicotine or tobacco. These products include cigarettes, chewing tobacco, and vaping devices, such as e-cigarettes. If you need help quitting, ask your health care provider. Do not use drugs. If you are sexually active, practice safe sex. Use a condom or other form of protection to prevent STIs. Take aspirin only as told by your health care provider. Make sure that you understand how much to take and what form to take. Work with your health care provider to find out whether it is safe and beneficial for you to take aspirin daily. Find healthy ways to manage stress, such as: Meditation, yoga, or listening to music. Journaling. Talking to a trusted person. Spending time with friends and family. Minimize exposure to UV radiation to reduce your risk of skin cancer. Safety Always wear your seat belt while driving or riding in a vehicle. Do not drive: If you have been drinking alcohol. Do not ride with someone who has been drinking. When you are tired or distracted. While texting. If you have been using any mind-altering substances or drugs. Wear a helmet and other protective equipment during sports activities. If you have firearms in your house, make sure you follow all gun safety procedures. What's next? Go to your health care provider once a year for an annual wellness visit. Ask your health care provider how often you should have your eyes and teeth  checked. Stay up to date on all vaccines. This information is not intended to replace advice given to you by your health care provider. Make sure you discuss any questions you have with your health care provider. Document Revised: 06/27/2020 Document Reviewed: 06/27/2020 Elsevier Patient Education  [place].

## 2021-11-12 NOTE — Assessment & Plan Note (Signed)
Check lipids.  He is working on weight loss.

## 2021-11-12 NOTE — Assessment & Plan Note (Signed)
BMI 31.9 today.  Continue lifestyle modifications.  He is doing well with Ozempic however ran out of medication over the last several weeks.  We will restart.  He will write back up to 2 mg weekly over the next couple of months.  He will follow-up in a couple weeks via [place].

## 2021-12-25 ENCOUNTER — Other Ambulatory Visit: Payer: Self-pay | Admitting: Family Medicine

## 2022-01-31 ENCOUNTER — Telehealth: Payer: Self-pay | Admitting: Family Medicine

## 2022-01-31 NOTE — Telephone Encounter (Signed)
Patient states his insurance requires a PA for OZEMPIC, 0.25 OR 0.5 MG/DOSE, 2 MG/3ML SOPN

## 2022-02-03 ENCOUNTER — Other Ambulatory Visit: Payer: Self-pay | Admitting: *Deleted

## 2022-02-03 NOTE — Telephone Encounter (Signed)
(  Key: Verl Bangs) - [id number] Ozempic (0.25 or 0.5 MG/DOSE) '2MG'$ [facility] Mediate pen-injectors Status: Sent: January 22nd, 2024 Waiting for determination

## 2022-02-05 NOTE — Telephone Encounter (Signed)
Patient states he was informed by [facility] that medication is not covered by insurance. Pt request recommendations from PCP on what else could be trialed.

## 2022-02-06 NOTE — Telephone Encounter (Signed)
Spoke with patient, advised to contact insurance for medication coverage  For E66.9

## 2022-02-12 ENCOUNTER — Encounter: Payer: Self-pay | Admitting: Family Medicine

## 2022-02-12 ENCOUNTER — Telehealth: Payer: Self-pay | Admitting: Family Medicine

## 2022-02-12 NOTE — Telephone Encounter (Signed)
Patient states his employer requires Patient to provide proof of flu vaccine and a copy of Patient's last Physical.  Patient requests to be called when the above documentation is ready for him to pick up.

## 2022-02-13 NOTE — Telephone Encounter (Signed)
Patient stated does not need records at this time

## 2022-07-17 ENCOUNTER — Other Ambulatory Visit: Payer: Self-pay | Admitting: Family Medicine

## 2022-09-14 ENCOUNTER — Encounter: Payer: Self-pay | Admitting: Family Medicine

## 2022-11-17 ENCOUNTER — Encounter: Payer: No Typology Code available for payment source | Admitting: Family Medicine

## 2022-12-03 ENCOUNTER — Other Ambulatory Visit: Payer: Self-pay | Admitting: Family Medicine

## 2022-12-22 ENCOUNTER — Encounter: Payer: Self-pay | Admitting: Family Medicine

## 2022-12-22 ENCOUNTER — Ambulatory Visit (INDEPENDENT_AMBULATORY_CARE_PROVIDER_SITE_OTHER): Payer: No Typology Code available for payment source | Admitting: Family Medicine

## 2022-12-22 VITALS — BP 134/87 | HR 67 | Temp 97.7°F | Ht 69.0 in | Wt 231.8 lb

## 2022-12-22 DIAGNOSIS — F331 Major depressive disorder, recurrent, moderate: Secondary | ICD-10-CM | POA: Diagnosis not present

## 2022-12-22 DIAGNOSIS — I10 Essential (primary) hypertension: Secondary | ICD-10-CM | POA: Diagnosis not present

## 2022-12-22 DIAGNOSIS — Z0001 Encounter for general adult medical examination with abnormal findings: Secondary | ICD-10-CM

## 2022-12-22 DIAGNOSIS — E785 Hyperlipidemia, unspecified: Secondary | ICD-10-CM

## 2022-12-22 DIAGNOSIS — Z23 Encounter for immunization: Secondary | ICD-10-CM

## 2022-12-22 DIAGNOSIS — N529 Male erectile dysfunction, unspecified: Secondary | ICD-10-CM

## 2022-12-22 DIAGNOSIS — F419 Anxiety disorder, unspecified: Secondary | ICD-10-CM

## 2022-12-22 DIAGNOSIS — E669 Obesity, unspecified: Secondary | ICD-10-CM

## 2022-12-22 MED ORDER — SERTRALINE HCL 50 MG PO TABS: 150.0000 mg | ORAL_TABLET | Freq: Every day | ORAL | 3 refills | Status: AC

## 2022-12-22 MED ORDER — TADALAFIL 10 MG PO TABS: 10.0000 mg | ORAL_TABLET | Freq: Every day | ORAL | 0 refills | Status: DC | PRN

## 2022-12-22 MED ORDER — LOSARTAN POTASSIUM 50 MG PO TABS: 100.0000 mg | ORAL_TABLET | Freq: Every day | ORAL | 1 refills | Status: DC

## 2022-12-22 MED ORDER — ZEPBOUND 2.5 MG/0.5ML ~~LOC~~ SOAJ: 2.5000 mg | SUBCUTANEOUS | 0 refills | Status: DC

## 2022-12-22 MED ORDER — BUPROPION HCL ER (XL) 150 MG PO TB24: 150.0000 mg | ORAL_TABLET | Freq: Every day | ORAL | 0 refills | Status: DC

## 2022-12-22 MED ORDER — TRAZODONE HCL 50 MG PO TABS: 25.0000 mg | ORAL_TABLET | Freq: Every evening | ORAL | 3 refills | Status: DC | PRN

## 2022-12-22 NOTE — Assessment & Plan Note (Signed)
Blood pressure at goal today though he is having some slightly elevated home readings.  We did discuss adding additional medications however hold off on this at this point.  We would be working on weight loss as below.  He will continue to monitor at home and let us know if persistently elevated.  Will continue losartan 100 mg daily.  Will refill today.

## 2022-12-22 NOTE — Assessment & Plan Note (Signed)
BMI 34.  He is up about 15 pounds since last year.  He did well with GLP-1 agonist however had to discontinue due to insurance changes.  He is interested in restarting.  We will start Zepbound 2.5 mg weekly for 4 weeks and then increase to 5 mg weekly.  We discussed potential side effects.  He will follow-up with [place] in a few weeks via MyChart and we can titrate the dose as needed.

## 2022-12-22 NOTE — Assessment & Plan Note (Signed)
Stable on tadalafil 10 mg daily as needed.  Will refill today.

## 2022-12-22 NOTE — Assessment & Plan Note (Signed)
Stable on sertraline 150mg  daily.

## 2022-12-22 NOTE — Progress Notes (Signed)
Chief Complaint:  Noah Gonzalez is a 52 y.o. male who presents today for his annual comprehensive physical exam.    Assessment/Plan:  Chronic Problems Addressed Today: Essential hypertension Blood pressure at goal today though he is having some slightly elevated home readings.  We did discuss adding additional medications however hold off on this at this point.  We would be working on weight loss as below.  He will continue to monitor at home and let us know if persistently elevated.  Will continue losartan 100 mg daily.  Will refill today.  Erectile dysfunction Stable on tadalafil 10 mg daily as needed.  Will refill today.  Major depressive disorder, recurrent episode, moderate (HCC) Stable on sertraline 150 mg daily and Wellbutrin 150 mg daily.  Anxiety Stable on sertraline 150 mg daily.  Obesity BMI 34.  He is up about 15 pounds since last year.  He did well with GLP-1 agonist however had to discontinue due to insurance changes.  He is interested in restarting.  We will start Zepbound 2.5 mg weekly for 4 weeks and then increase to 5 mg weekly.  We discussed potential side effects.  He will follow-up with [place] in a few weeks via MyChart and we can titrate the dose as needed.  Dyslipidemia We discussed checking labs today however he would like at this time to endocrinology office.  He will let us know if they can do that and he will come back here for lipid panel.  Preventative Healthcare: Deferred colon cancer screening today.  Flu vaccine given today. Up-to-date on vaccines.  We discussed labs -he will try to get them done with his endocrinologist office and defer checking today.  He will let us know if endocrinology cannot check labs and he will come back here for CMET, CBC, lipid panel, and TSH.  Patient Counseling(The following topics were reviewed and/or handout was given):  -Nutrition: Stressed importance of moderation in sodium/caffeine intake, saturated fat and cholesterol,  caloric balance, sufficient intake of fresh fruits, vegetables, and fiber.  -Stressed the importance of regular exercise.   -Substance Abuse: Discussed cessation/primary prevention of tobacco, alcohol, or other drug use; driving or other dangerous activities under the influence; availability of treatment for abuse.   -Injury prevention: Discussed safety belts, safety helmets, smoke detector, smoking near bedding or upholstery.   -Sexuality: Discussed sexually transmitted diseases, partner selection, use of condoms, avoidance of unintended pregnancy and contraceptive alternatives.   -Dental health: Discussed importance of regular tooth brushing, flossing, and dental visits.  -Health maintenance and immunizations reviewed. Please refer to Health maintenance section.  Return to care in 1 year for next preventative visit.     Subjective:  HPI:  He has no acute complaints today. He is worried about his blood pressure. He will frequently check his BP at home and work and seeing readings into the 140's/80's.   Since our last visit he did stop taking Ozempic due to insurance changes. He has gained weight the last several months.   Lifestyle Diet: Balanced. Plenty of fruits and vegetables.  Exercise: Working out about 5 days per week.      12/22/2022    8:35 AM  Depression screen PHQ 2/9  Decreased Interest 0  Down, Depressed, Hopeless 0  PHQ - 2 Score 0   ROS: Per HPI, otherwise a complete review of systems was negative.   PMH:  The following were reviewed and entered/updated in epic: Past Medical History:  Diagnosis Date   Arthritis  osteoarthritis-hips   Cancer (HCC) 11/13/2017   prostate cancer   GERD (gastroesophageal reflux disease)    12/26/15- states has resolved   Hypertension    history of- has lost 40 lbs   Low serum testosterone level    recent tx. done   Patient Active Problem List   Diagnosis Date Noted   Dyslipidemia 03/09/2020   Insomnia 09/01/2019    Obesity 09/01/2019   Prostate cancer [facility]) s/p prostatectomy 2020 05/24/2018   Major depressive disorder, recurrent episode, moderate (HCC) 11/24/2017   Anxiety 11/24/2017   Palpitations 06/19/2017   Family history of prostate cancer 06/19/2017   History of bilateral hip replacements 01/04/2016   Gynecomastia 02/02/2015   Erectile dysfunction 11/20/2014   Essential hypertension 11/03/2014   Male hypogonadism 11/03/2014   Past Surgical History:  Procedure Laterality Date   PROSTATECTOMY Bilateral 03/14/2018   TOTAL HIP ARTHROPLASTY Right 01/21/2013   Procedure: RIGHT TOTAL HIP ARTHROPLASTY ANTERIOR APPROACH;  Surgeon: Kathryne Hitch, MD;  Location: [facility];  Service: Orthopedics;  Laterality: Right;   TOTAL HIP ARTHROPLASTY Left 01/04/2016   Procedure: LEFT TOTAL HIP ARTHROPLASTY ANTERIOR APPROACH;  Surgeon: Kathryne Hitch, MD;  Location: [facility];  Service: Orthopedics;  Laterality: Left;   WISDOM TOOTH EXTRACTION      Family History  Problem Relation Age of Onset   Pancreatic cancer Mother    Lung cancer Maternal Grandfather    Prostate cancer Paternal Grandfather     Medications- reviewed and updated Current Outpatient Medications  Medication Sig Dispense Refill   Melatonin 10 MG CAPS Take by mouth.     testosterone cypionate (DEPOTESTOSTERONE CYPIONATE) 200 MG/ML injection Inject into the muscle every 14 (fourteen) days.     tirzepatide (ZEPBOUND) 2.5 MG/0.5ML Pen Inject 2.5 mg into the skin once a week. 2 mL 0   buPROPion (WELLBUTRIN XL) 150 MG 24 hr tablet Take 1 tablet (150 mg total) by mouth daily. 30 tablet 0   losartan (COZAAR) 50 MG tablet Take 2 tablets (100 mg total) by mouth daily. 180 tablet 1   sertraline (ZOLOFT) 50 MG tablet Take 3 tablets (150 mg total) by mouth daily. 270 tablet 3   tadalafil (CIALIS) 10 MG tablet Take 1 tablet (10 mg total) by mouth daily as needed for erectile dysfunction. 90 tablet 0   traZODone (DESYREL) 50 MG tablet Take 0.5-1  tablets (25-50 mg total) by mouth at bedtime as needed for sleep. 90 tablet 3   No current facility-administered medications for this visit.    Allergies-reviewed and updated Allergies  Allergen Reactions   Hydrocodone Nausea And Vomiting    Headache    Oxycodone Hcl Other (See Comments)    Causes severe headache    Social History   Socioeconomic History   Marital status: Married    Spouse name: Not on file   Number of children: 0   Years of education: Not on file   Highest education level: Not on file  Occupational History    Comment: [employer]  Tobacco Use   Smoking status: Former    Current packs/day: 0.00    Types: Cigarettes    Start date: 01/18/2003    Quit date: 01/17/2005    Years since quitting: 17.9   Smokeless tobacco: Former    Types: Chew   Tobacco comments:    quit 6 yrs ago  Substance and Sexual Activity   Alcohol use: No   Drug use: No   Sexual activity: Yes  Other  Topics Concern   Not on file  Social History Narrative   Not on file   Social Determinants of Health   Financial Resource Strain: Not on file  Food Insecurity: Not on file  Transportation Needs: Not on file  Physical Activity: Not on file  Stress: Not on file  Social Connections: Unknown (05/24/2021)   Received from [facility]   Social Network    Social Network: Not on file        Objective:  Physical Exam: BP 134/87   Pulse 67   Temp 97.7 F (36.5 C) (Temporal)   Ht 5\' 9"  (1.753 m)   Wt 231 lb 12.8 oz (105.1 kg)   SpO2 96%   BMI 34.23 kg/m   Body mass index is 34.23 kg/m. Wt Readings from Last 3 Encounters:  12/22/22 231 lb 12.8 oz (105.1 kg)  11/12/21 216 lb (98 kg)  09/26/20 220 lb (99.8 kg)   Gen: NAD, resting comfortably HEENT: TMs normal bilaterally. OP clear. No thyromegaly noted.  CV: RRR with no murmurs appreciated Pulm: NWOB, CTAB with no crackles, wheezes, or rhonchi GI: Normal bowel sounds present. Soft, Nontender,  Nondistended. MSK: no edema, cyanosis, or clubbing noted Skin: warm, dry Neuro: CN2-12 grossly intact. Strength 5/5 in upper and lower extremities. Reflexes symmetric and intact bilaterally.  Psych: Normal affect and thought content     Haliyah Fryman M. Jimmey Ralph, MD 12/22/2022 9:21 AM

## 2022-12-22 NOTE — Assessment & Plan Note (Signed)
Stable on sertraline 150 mg daily and Wellbutrin 150 mg daily.

## 2022-12-22 NOTE — Assessment & Plan Note (Signed)
We discussed checking labs today however he would like at this time to endocrinology office.  He will let us know if they can do that and he will come back here for lipid panel.

## 2022-12-22 NOTE — Patient Instructions (Signed)
It was very nice to see you today!  We will start Zepbound.  I will refill your other medications.  Please send a message in a few weeks to let me know how you are doing.  Please come back here for labs if you endocrinologist cannot check them.  Return in about 1 year (around 12/22/2023) for Annual Physical.   Take care, Dr Jimmey Ralph  PLEASE NOTE:  If you had any lab tests, please let us know if you have not heard back within a few days. You may see your results on mychart before we have a chance to review them but we will give you a call once they are reviewed by [place].   If we ordered any referrals today, please let us know if you have not heard from their office within the next week.   If you had any urgent prescriptions sent in today, please check with the pharmacy within an hour of our visit to make sure the prescription was transmitted appropriately.   Please try these tips to maintain a healthy lifestyle:  Eat at least 3 REAL meals and 1-2 snacks per day.  Aim for no more than 5 hours between eating.  If you eat breakfast, please do so within one hour of getting up.   Each meal should contain half fruits/vegetables, one quarter protein, and one quarter carbs (no bigger than a computer mouse)  Cut down on sweet beverages. This includes juice, soda, and sweet tea.   Drink at least 1 glass of water with each meal and aim for at least 8 glasses per day  Exercise at least 150 minutes every week.    Preventive Care 86-54 Years Old, Male Preventive care refers to lifestyle choices and visits with your health care provider that can promote health and wellness. Preventive care visits are also called wellness exams. What can I expect for my preventive care visit? Counseling During your preventive care visit, your health care provider may ask about your: Medical history, including: Past medical problems. Family medical history. Current health, including: Emotional well-being. Home life  and relationship well-being. Sexual activity. Lifestyle, including: Alcohol, nicotine or tobacco, and drug use. Access to firearms. Diet, exercise, and sleep habits. Safety issues such as seatbelt and bike helmet use. Sunscreen use. Work and work Astronomer. Physical exam Your health care provider will check your: Height and weight. These may be used to calculate your BMI (body mass index). BMI is a measurement that tells if you are at a healthy weight. Waist circumference. This measures the distance around your waistline. This measurement also tells if you are at a healthy weight and may help predict your risk of certain diseases, such as type 2 diabetes and high blood pressure. Heart rate and blood pressure. Body temperature. Skin for abnormal spots. What immunizations do I need?  Vaccines are usually given at various ages, according to a schedule. Your health care provider will recommend vaccines for you based on your age, medical history, and lifestyle or other factors, such as travel or where you work. What tests do I need? Screening Your health care provider may recommend screening tests for certain conditions. This may include: Lipid and cholesterol levels. Diabetes screening. This is done by checking your blood sugar (glucose) after you have not eaten for a while (fasting). Hepatitis B test. Hepatitis C test. HIV (human immunodeficiency virus) test. STI (sexually transmitted infection) testing, if you are at risk. Lung cancer screening. Prostate cancer screening. Colorectal cancer screening. Talk  with your health care provider about your test results, treatment options, and if necessary, the need for more tests. Follow these instructions at home: Eating and drinking  Eat a diet that includes fresh fruits and vegetables, whole grains, lean protein, and low-fat dairy products. Take vitamin and mineral supplements as recommended by your health care provider. Do not drink  alcohol if your health care provider tells you not to drink. If you drink alcohol: Limit how much you have to 0-2 drinks a day. Know how much alcohol is in your drink. In the [place] one drink equals one 12 oz bottle of beer (355 mL), one 5 oz glass of wine (148 mL), or one 1 oz glass of hard liquor (44 mL). Lifestyle Brush your teeth every morning and night with fluoride toothpaste. Floss one time each day. Exercise for at least 30 minutes 5 or more days each week. Do not use any products that contain nicotine or tobacco. These products include cigarettes, chewing tobacco, and vaping devices, such as e-cigarettes. If you need help quitting, ask your health care provider. Do not use drugs. If you are sexually active, practice safe sex. Use a condom or other form of protection to prevent STIs. Take aspirin only as told by your health care provider. Make sure that you understand how much to take and what form to take. Work with your health care provider to find out whether it is safe and beneficial for you to take aspirin daily. Find healthy ways to manage stress, such as: Meditation, yoga, or listening to music. Journaling. Talking to a trusted person. Spending time with friends and family. Minimize exposure to UV radiation to reduce your risk of skin cancer. Safety Always wear your seat belt while driving or riding in a vehicle. Do not drive: If you have been drinking alcohol. Do not ride with someone who has been drinking. When you are tired or distracted. While texting. If you have been using any mind-altering substances or drugs. Wear a helmet and other protective equipment during sports activities. If you have firearms in your house, make sure you follow all gun safety procedures. What's next? Go to your health care provider once a year for an annual wellness visit. Ask your health care provider how often you should have your eyes and teeth checked. Stay up to date on all  vaccines. This information is not intended to replace advice given to you by your health care provider. Make sure you discuss any questions you have with your health care provider. Document Revised: 06/27/2020 Document Reviewed: 06/27/2020 Elsevier Patient Education  2024 [employer].

## 2022-12-23 ENCOUNTER — Encounter: Payer: Self-pay | Admitting: Family Medicine

## 2022-12-24 NOTE — Telephone Encounter (Signed)
Please start a PA for [facility] thanks

## 2022-12-30 ENCOUNTER — Telehealth: Payer: Self-pay

## 2022-12-30 ENCOUNTER — Other Ambulatory Visit (HOSPITAL_COMMUNITY): Payer: Self-pay

## 2022-12-30 NOTE — Telephone Encounter (Signed)
Pharmacy Patient Advocate Encounter  Received notification from [employer] that Prior Authorization for Zepbound 2.5MG /0.5ML pen-injectors has been APPROVED from 12/30/2022 to 07/30/2023   PA #/Case ID/Reference #: [id number]

## 2022-12-30 NOTE — Telephone Encounter (Signed)
Pharmacy Patient Advocate Encounter   Received notification from Patient Advice Request messages that prior authorization for Zepbound 2.5MG /0.5ML pen-injectors is required/requested.   Insurance verification completed.   The patient is insured through [employer] .   Per test claim: PA required; PA submitted to above mentioned insurance via CoverMyMeds Key/confirmation #/EOC [facility] Status is pending

## 2023-01-26 ENCOUNTER — Other Ambulatory Visit: Payer: Self-pay | Admitting: Family Medicine

## 2023-02-23 ENCOUNTER — Other Ambulatory Visit: Payer: Self-pay | Admitting: Family Medicine

## 2023-02-25 ENCOUNTER — Other Ambulatory Visit: Payer: Self-pay | Admitting: Family Medicine

## 2023-03-04 ENCOUNTER — Other Ambulatory Visit: Payer: Self-pay | Admitting: Family Medicine

## 2023-04-01 ENCOUNTER — Other Ambulatory Visit: Payer: Self-pay | Admitting: Family Medicine

## 2023-04-21 ENCOUNTER — Other Ambulatory Visit: Payer: Self-pay | Admitting: Family Medicine

## 2023-04-27 ENCOUNTER — Other Ambulatory Visit: Payer: Self-pay | Admitting: Family Medicine

## 2023-04-29 ENCOUNTER — Other Ambulatory Visit: Payer: Self-pay | Admitting: *Deleted

## 2023-04-29 MED ORDER — ZEPBOUND 2.5 MG/0.5ML ~~LOC~~ SOAJ: 2.5000 mg | SUBCUTANEOUS | 0 refills | Status: DC

## 2023-05-12 ENCOUNTER — Other Ambulatory Visit: Payer: Self-pay | Admitting: Family Medicine

## 2023-07-08 ENCOUNTER — Encounter: Payer: Self-pay | Admitting: Family Medicine

## 2023-07-09 ENCOUNTER — Other Ambulatory Visit: Payer: Self-pay | Admitting: *Deleted

## 2023-07-09 MED ORDER — TIRZEPATIDE-WEIGHT MANAGEMENT 5 MG/0.5ML ~~LOC~~ SOLN: 5.0000 mg | SUBCUTANEOUS | 0 refills | Status: DC

## 2023-07-31 ENCOUNTER — Telehealth: Payer: Self-pay

## 2023-07-31 NOTE — Telephone Encounter (Signed)
 Pharmacy Patient Advocate Encounter   Received notification from CoverMyMeds that prior authorization for Zepbound  2.5MG /0.5ML pen-injectors is required/requested.   Insurance verification completed.   The patient is insured through [employer] .   Per test claim: PA required; PA submitted to above mentioned insurance via CoverMyMeds Key/confirmation #/EOC [facility] Status is pending

## 2023-08-02 ENCOUNTER — Other Ambulatory Visit: Payer: Self-pay | Admitting: Family Medicine

## 2023-08-03 ENCOUNTER — Other Ambulatory Visit (HOSPITAL_COMMUNITY): Payer: Self-pay

## 2023-08-03 NOTE — Telephone Encounter (Signed)
 Pharmacy Patient Advocate Encounter  Received notification from [employer] that Prior Authorization for Zepbound  2.5MG /0.5ML pen-injectors  has been APPROVED from 07/31/23 to 02/28/24   PA #/Case ID/Reference #: [id number]

## 2023-08-29 ENCOUNTER — Other Ambulatory Visit: Payer: Self-pay | Admitting: Family Medicine

## 2023-09-15 ENCOUNTER — Other Ambulatory Visit: Payer: Self-pay | Admitting: Family Medicine

## 2023-10-01 ENCOUNTER — Other Ambulatory Visit: Payer: Self-pay | Admitting: Family Medicine

## 2023-10-13 ENCOUNTER — Other Ambulatory Visit: Payer: Self-pay | Admitting: Family Medicine

## 2023-12-13 ENCOUNTER — Other Ambulatory Visit: Payer: Self-pay | Admitting: Family Medicine

## 2023-12-28 ENCOUNTER — Encounter: Payer: Self-pay | Admitting: Family Medicine

## 2023-12-28 ENCOUNTER — Ambulatory Visit: Payer: No Typology Code available for payment source | Admitting: Family Medicine

## 2023-12-28 VITALS — BP 120/80 | HR 61 | Temp 97.2°F | Ht 69.0 in | Wt 232.2 lb

## 2023-12-28 DIAGNOSIS — R739 Hyperglycemia, unspecified: Secondary | ICD-10-CM | POA: Diagnosis not present

## 2023-12-28 DIAGNOSIS — Z1159 Encounter for screening for other viral diseases: Secondary | ICD-10-CM

## 2023-12-28 DIAGNOSIS — N529 Male erectile dysfunction, unspecified: Secondary | ICD-10-CM

## 2023-12-28 DIAGNOSIS — F339 Major depressive disorder, recurrent, unspecified: Secondary | ICD-10-CM

## 2023-12-28 DIAGNOSIS — F419 Anxiety disorder, unspecified: Secondary | ICD-10-CM

## 2023-12-28 DIAGNOSIS — Z6834 Body mass index (BMI) 34.0-34.9, adult: Secondary | ICD-10-CM

## 2023-12-28 DIAGNOSIS — I10 Essential (primary) hypertension: Secondary | ICD-10-CM

## 2023-12-28 DIAGNOSIS — H9319 Tinnitus, unspecified ear: Secondary | ICD-10-CM

## 2023-12-28 DIAGNOSIS — E785 Hyperlipidemia, unspecified: Secondary | ICD-10-CM

## 2023-12-28 DIAGNOSIS — Z0001 Encounter for general adult medical examination with abnormal findings: Secondary | ICD-10-CM

## 2023-12-28 DIAGNOSIS — Z23 Encounter for immunization: Secondary | ICD-10-CM

## 2023-12-28 DIAGNOSIS — E669 Obesity, unspecified: Secondary | ICD-10-CM

## 2023-12-28 DIAGNOSIS — G4700 Insomnia, unspecified: Secondary | ICD-10-CM

## 2023-12-28 DIAGNOSIS — Z13220 Encounter for screening for lipoid disorders: Secondary | ICD-10-CM

## 2023-12-28 DIAGNOSIS — Z114 Encounter for screening for human immunodeficiency virus [HIV]: Secondary | ICD-10-CM

## 2023-12-28 LAB — COMPREHENSIVE METABOLIC PANEL WITH GFR
ALT: 25 U/L (ref 0–53)
AST: 25 U/L (ref 0–37)
Albumin: 4.6 g/dL (ref 3.5–5.2)
Alkaline Phosphatase: 41 U/L (ref 39–117)
BUN: 13 mg/dL (ref 6–23)
CO2: 28 meq/L (ref 19–32)
Calcium: 9.8 mg/dL (ref 8.4–10.5)
Chloride: 99 meq/L (ref 96–112)
Creatinine, Ser: 0.96 mg/dL (ref 0.40–1.50)
GFR: 89.05 mL/min (ref >60.00)
Glucose, Bld: 110 mg/dL — ABNORMAL HIGH (ref 70–99)
Potassium: 3.9 meq/L (ref 3.5–5.1)
Sodium: 134 meq/L — ABNORMAL LOW (ref 135–145)
Total Bilirubin: 0.6 mg/dL (ref 0.2–1.2)
Total Protein: 7.2 g/dL (ref 6.0–8.3)

## 2023-12-28 LAB — LIPID PANEL
Cholesterol: 184 mg/dL (ref 0–200)
HDL: 53.2 mg/dL (ref >39.00)
LDL Cholesterol: 99 mg/dL (ref 0–99)
NonHDL: 131.01
Total CHOL/HDL Ratio: 3
Triglycerides: 158 mg/dL — ABNORMAL HIGH (ref 0.0–149.0)
VLDL: 31.6 mg/dL (ref 0.0–40.0)

## 2023-12-28 LAB — CBC
HCT: 46.4 % (ref 39.0–52.0)
Hemoglobin: 16.1 g/dL (ref 13.0–17.0)
MCHC: 34.7 g/dL (ref 30.0–36.0)
MCV: 89.9 fl (ref 78.0–100.0)
Platelets: 242 K/uL (ref 150.0–400.0)
RBC: 5.15 Mil/uL (ref 4.22–5.81)
RDW: 13.7 % (ref 11.5–15.5)
WBC: 5.1 K/uL (ref 4.0–10.5)

## 2023-12-28 LAB — HEMOGLOBIN A1C: Hgb A1c MFr Bld: 5.5 % (ref 4.6–6.5)

## 2023-12-28 MED ORDER — PHENTERMINE HCL 30 MG PO CAPS: 30.0000 mg | ORAL_CAPSULE | ORAL | 2 refills | Status: AC

## 2023-12-28 NOTE — Assessment & Plan Note (Signed)
Stable on trazodone 25 to 50 mg nightly as needed. ?

## 2023-12-28 NOTE — Assessment & Plan Note (Signed)
 Blood pressure at goal today on losartan 100 mg daily.  Check labs.

## 2023-12-28 NOTE — Assessment & Plan Note (Signed)
 Stable on Zoloft  150 mg daily and Wellbutrin  150 mg daily.

## 2023-12-28 NOTE — Progress Notes (Signed)
 Chief Complaint:  Noah Gonzalez is a 53 y.o. male who presents today for his annual comprehensive physical exam.    Assessment/Plan:  New/Acute Problems: Tinnitus  No red flags. Likely related to presbycusis. Normal exam today. Will refer to audiology.   Chronic Problems Addressed Today: Obesity BMI 34.29 today.  He does have comorbidities including dyslipidemia and meets medical indication for pharmacologic treatment for his obesity.  We tried Zepbound  last year and he had modest success with this however insurance is no longer paying for this and he would like to try alternatives.  We discussed potential treatment options including alternative GLP versus higher dose of Zepbound  as well as non-GLP medications.    Will start phentermine  30 mg daily.  We discussed potential side effects and the fact that he should only be on this for 2 to 3 months at a time.  He will follow-up with us  in a few weeks.  Essential hypertension Blood pressure at goal today on losartan  100 mg daily.  Check labs.  Recurrent depression Stable on Zoloft  150 mg daily and Wellbutrin  150 mg daily.  Dyslipidemia Check lipid panel.  Erectile dysfunction Patient discontinued Cialis  due to causing issues with reflux.  We did discuss trial of Viagra  however he declined.  He will let us  know if he needs any further assistance.  Anxiety Well-controlled on sertraline  150 mg daily.  Insomnia Stable on trazodone  25 to 50 mg nightly as needed.  Preventative Healthcare: Check labs.  Flu shot given today.  Due for colon cancer screening-has Cologuard at home and will complete this soon  Patient Counseling(The following topics were reviewed and/or handout was given):  -Nutrition: Stressed importance of moderation in sodium/caffeine intake, saturated fat and cholesterol, caloric balance, sufficient intake of fresh fruits, vegetables, and fiber.  -Stressed the importance of regular exercise.   -Substance Abuse:  Discussed cessation/primary prevention of tobacco, alcohol, or other drug use; driving or other dangerous activities under the influence; availability of treatment for abuse.   -Injury prevention: Discussed safety belts, safety helmets, smoke detector, smoking near bedding or upholstery.   -Sexuality: Discussed sexually transmitted diseases, partner selection, use of condoms, avoidance of unintended pregnancy and contraceptive alternatives.   -Dental health: Discussed importance of regular tooth brushing, flossing, and dental visits.  -Health maintenance and immunizations reviewed. Please refer to Health maintenance section.  Return to care in 1 year for next preventative visit.     Subjective:  HPI:  He has no acute complaints today. Patient is here today for his annual physical.  See assessment / plan for status of chronic conditions.  Discussed the use of AI scribe software for clinical note transcription with the patient, who gave verbal consent to proceed.  History of Present Illness Noah Gonzalez is a 53 year old male who presents for an annual physical exam.  He has experienced weight gain and difficulty controlling his appetite, particularly in stressful situations. He previously used Tirzepatide  (Zepbound ) for weight loss but discontinued it a few months ago due to lack of insurance coverage and perceived ineffectiveness. He describes himself as a 'stress eater' and notes that transitioning to a new work environment was initially stressful, contributing to his eating habits. While on Zepbound , he felt his appetite was reduced but experienced low blood sugar and carbohydrate cravings. He wants to lose 10 to 20 pounds but finds it difficult to control his appetite on his own. He has not checked with his insurance about coverage for other weight  loss medications and is unsure how to proceed as he is self-insured. The cost of Zepbound  was under $100 out of pocket, and his total medication  costs are about $50 to $60 when picking up multiple prescriptions.  He is not currently working out as much as before but gets plenty of steps at work and occasionally does a 20-minute workout in his garage. He estimates walking a minimum of a mile at work daily. He acknowledges not consuming as many fruits and vegetables as he should.  He discontinued Cialis  due to severe heartburn and states that he does not need it as much as his losartan . He has some Viagra  from a previous prescription at home. He is currently taking Zoloft  and Wellbutrin .  He experiences tinnitus, which he describes as hearing ringing, especially when stressed. He has not seen an audiologist but has conducted crude hearing tests using a decibel meter on his phone, which did not indicate any issues. He has a history of noise exposure from working in a [employer], racing motocross, and listening to loud music. 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      12/28/2023    8:09 AM  Depression screen PHQ 2/9  Decreased Interest 0  Down, Depressed, Hopeless 0  PHQ - 2 Score 0  Altered sleeping 0  Tired, decreased energy 0  Change in appetite 0  Feeling bad or failure about yourself  0  Trouble concentrating 0  Moving slowly or fidgety/restless 0  Suicidal thoughts 0  PHQ-9 Score 0  Difficult doing work/chores Not difficult at all    Health Maintenance Due  Topic Date Due   HIV Screening  Never done   Hepatitis C Screening  Never done   Hepatitis B Vaccines 19-59 Average Risk (1 of 3 - 19+ 3-dose series) Never done   Fecal DNA (Cologuard)  Never done     ROS: Per HPI, otherwise a complete review of systems was negative.   PMH:  The following were reviewed and entered/updated in epic: Past Medical History:  Diagnosis Date   Arthritis    osteoarthritis-hips   Cancer (HCC) 11/13/2017   prostate cancer   GERD (gastroesophageal reflux disease)    12/26/15- states has resolved   Hypertension     history of- has lost 40 lbs   Low serum testosterone  level    recent tx. 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   Past Surgical History:  Procedure Laterality Date   PROSTATECTOMY Bilateral 03/14/2018   TOTAL HIP ARTHROPLASTY Right 01/21/2013   Procedure: RIGHT TOTAL HIP ARTHROPLASTY ANTERIOR APPROACH;  Surgeon: Lonni CINDERELLA Poli, MD;  Location: [facility];  Service: Orthopedics;  Laterality: Right;   TOTAL HIP ARTHROPLASTY Left 01/04/2016   Procedure: LEFT TOTAL HIP ARTHROPLASTY ANTERIOR APPROACH;  Surgeon: Lonni CINDERELLA Poli, MD;  Location: [facility];  Service: Orthopedics;  Laterality: Left;   WISDOM TOOTH EXTRACTION      Family History  Problem Relation Age of Onset   Pancreatic cancer Mother    Lung cancer Maternal Grandfather  Prostate cancer Paternal Grandfather     Medications- reviewed and updated Current Outpatient Medications  Medication Sig Dispense Refill   phentermine  30 MG capsule Take 1 capsule (30 mg total) by mouth every morning. 30 capsule 2   buPROPion  (WELLBUTRIN  XL) 150 MG 24 hr tablet Take 1 tablet by mouth once daily 90 tablet 0   losartan  (COZAAR ) 50 MG tablet Take 2 tablets by mouth once daily 180 tablet 0   Melatonin 10 MG CAPS Take by mouth.     sertraline  (ZOLOFT ) 50 MG tablet Take 3 tablets (150 mg total) by mouth daily. 270 tablet 3   tadalafil  (CIALIS ) 10 MG tablet TAKE 1 TABLET BY MOUTH ONCE DAILY AS NEEDED FOR ERECTILE DYSFUNCTION 90 tablet 0   testosterone  cypionate (DEPOTESTOSTERONE  CYPIONATE) 200 MG/ML injection Inject into the muscle every 14 (fourteen) days.     traZODone  (DESYREL ) 50 MG tablet Take 0.5-1 tablets (25-50 mg total) by mouth at bedtime as needed for sleep. 90 tablet 3   No current facility-administered medications for this visit.    Allergies-reviewed and updated Allergies[1]  Social History   Socioeconomic History   Marital status: Married    Spouse name: Not on file   Number of children: 0   Years of education: Not on file   Highest education level: Not on file  Occupational History    Comment: [employer]  Tobacco Use   Smoking status: Former    Current packs/day: 0.00    Types: Cigarettes    Start date: 01/18/2003    Quit date: 01/17/2005    Years since quitting: 18.9   Smokeless tobacco: Former    Types: Chew   Tobacco comments:    quit 6 yrs ago  Substance and Sexual Activity   Alcohol use: No   Drug use: No   Sexual activity: Yes  Other Topics Concern   Not on file  Social History Narrative   Not on file   Social Drivers of Health   Tobacco Use: Medium Risk (12/28/2023)   Patient History    Smoking Tobacco Use: Former    Smokeless Tobacco Use: Former    Passive Exposure: Not on Stage Manager: Not on file  Food Insecurity: Not on file  Transportation Needs: Not on file  Physical Activity: Not on file  Stress: Not on file  Social Connections: Unknown (05/24/2021)   Received from [facility]   Social Network    Social Network: Not on file  Depression (PHQ2-9): Low Risk (12/28/2023)   Depression (PHQ2-9)    PHQ-2 Score: 0  Alcohol Screen: Not on file  Housing: Not on file  Utilities: Not on file  Health Literacy: Not on file        Objective:  Physical Exam: BP 120/80   Pulse 61   Temp (!) 97.2 F (36.2 C) (Temporal)   Ht 5' 9 (1.753 m)   Wt 232 lb 3.2 oz (105.3 kg)   SpO2 98%   BMI 34.29 kg/m   Body mass index is 34.29 kg/m. Wt Readings from Last 3 Encounters:  12/28/23 232  lb 3.2 oz (105.3 kg)  12/22/22 231 lb 12.8 oz (105.1 kg)  11/12/21 216 lb (98 kg)   Gen: NAD, resting comfortably HEENT: TMs normal bilaterally. OP clear. No thyromegaly noted.  CV: RRR with no murmurs appreciated Pulm: NWOB, CTAB with no crackles, wheezes, or rhonchi GI: Normal bowel sounds present. Soft, Nontender, Nondistended. MSK: no edema, cyanosis, or clubbing  noted Skin: warm, dry Neuro: CN2-12 grossly intact. Strength 5/5 in upper and lower extremities. Reflexes symmetric and intact bilaterally.  Psych: Normal affect and thought content     Dangelo Guzzetta M. Kennyth, MD 12/28/2023 8:24 AM     [1]  Allergies Allergen Reactions   Hydrocodone  Nausea And Vomiting    Headache    Oxycodone  Hcl Other (See Comments)    Causes severe headache

## 2023-12-28 NOTE — Assessment & Plan Note (Signed)
 Patient discontinued Cialis  due to causing issues with reflux.  We did discuss trial of Viagra  however he declined.  He will let us  know if he needs any further assistance.

## 2023-12-28 NOTE — Patient Instructions (Signed)
 It was very nice to see you today!  VISIT SUMMARY: During your annual physical exam, we discussed your weight gain, stress-related eating habits, and previous use of weight loss medication. We also reviewed your blood pressure, depression management, and tinnitus. You received a flu shot and were encouraged to complete routine health maintenance tasks.  YOUR PLAN: OBESITY: Weight gain attributed to stress and diet. Previous medication was ineffective and not covered by insurance. -Start taking Phentermine  as prescribed and monitor for any side effects. -Follow up in 1-2 months to assess progress.  ESSENTIAL HYPERTENSION: Your blood pressure is well-controlled with your current medication. -Continue taking Losartan  100 MG daily.  MAJOR DEPRESSIVE DISORDER, RECURRENT EPISODE: Your depression symptoms are well-controlled with your current medications. -Continue taking Sertraline  150 MG daily and Bupropion  150 MG daily.  ERECTILE DYSFUNCTION: Tadalafil  causes heartburn. -Consider using Viagra  as needed.  TINNITUS: Likely due to past noise exposure. -You are referred to an audiologist for further evaluation.  GENERAL HEALTH MAINTENANCE: Routine health maintenance tasks. -Blood work has been ordered. -You received a flu shot today. -Please complete the Cologuard test as discussed.  Return in about 1 year (around 12/27/2024) for Annual Physical.   Take care, Dr Kennyth  PLEASE NOTE:  If you had any lab tests, please let us  know if you have not heard back within a few days. You may see your results on mychart before we have a chance to review them but we will give you a call once they are reviewed by us .   If we ordered any referrals today, please let us  know if you have not heard from their office within the next week.   If you had any urgent prescriptions sent in today, please check with the pharmacy within an hour of our visit to make sure the prescription was transmitted  appropriately.   Please try these tips to maintain a healthy lifestyle:  Eat at least 3 REAL meals and 1-2 snacks per day.  Aim for no more than 5 hours between eating.  If you eat breakfast, please do so within one hour of getting up.   Each meal should contain half fruits/vegetables, one quarter protein, and one quarter carbs (no bigger than a computer mouse)  Cut down on sweet beverages. This includes juice, soda, and sweet tea.   Drink at least 1 glass of water  with each meal and aim for at least 8 glasses per day  Exercise at least 150 minutes every week.    Preventive Care 22-73 Years Old, Male Preventive care refers to lifestyle choices and visits with your health care provider that can promote health and wellness. Preventive care visits are also called wellness exams. What can I expect for my preventive care visit? Counseling During your preventive care visit, your health care provider may ask about your: Medical history, including: Past medical problems. Family medical history. Current health, including: Emotional well-being. Home life and relationship well-being. Sexual activity. Lifestyle, including: Alcohol, nicotine or tobacco, and drug use. Access to firearms. Diet, exercise, and sleep habits. Safety issues such as seatbelt and bike helmet use. Sunscreen use. Work and work astronomer. Physical exam Your health care provider will check your: Height and weight. These may be used to calculate your BMI (body mass index). BMI is a measurement that tells if you are at a healthy weight. Waist circumference. This measures the distance around your waistline. This measurement also tells if you are at a healthy weight and may help predict your risk  of certain diseases, such as type 2 diabetes and high blood pressure. Heart rate and blood pressure. Body temperature. Skin for abnormal spots. What immunizations do I need?  Vaccines are usually given at various ages,  according to a schedule. Your health care provider will recommend vaccines for you based on your age, medical history, and lifestyle or other factors, such as travel or where you work. What tests do I need? Screening Your health care provider may recommend screening tests for certain conditions. This may include: Lipid and cholesterol levels. Diabetes screening. This is done by checking your blood sugar (glucose) after you have not eaten for a while (fasting). Hepatitis B test. Hepatitis C test. HIV (human immunodeficiency virus) test. STI (sexually transmitted infection) testing, if you are at risk. Lung cancer screening. Prostate cancer screening. Colorectal cancer screening. Talk with your health care provider about your test results, treatment options, and if necessary, the need for more tests. Follow these instructions at home: Eating and drinking  Eat a diet that includes fresh fruits and vegetables, whole grains, lean protein, and low-fat dairy products. Take vitamin and mineral supplements as recommended by your health care provider. Do not drink alcohol if your health care provider tells you not to drink. If you drink alcohol: Limit how much you have to 0-2 drinks a day. Know how much alcohol is in your drink. In the [place] one drink equals one 12 oz bottle of beer (355 mL), one 5 oz glass of wine (148 mL), or one 1 oz glass of hard liquor (44 mL). Lifestyle Brush your teeth every morning and night with fluoride toothpaste. Floss one time each day. Exercise for at least 30 minutes 5 or more days each week. Do not use any products that contain nicotine or tobacco. These products include cigarettes, chewing tobacco, and vaping devices, such as e-cigarettes. If you need help quitting, ask your health care provider. Do not use drugs. If you are sexually active, practice safe sex. Use a condom or other form of protection to prevent STIs. Take aspirin  only as told by your health care  provider. Make sure that you understand how much to take and what form to take. Work with your health care provider to find out whether it is safe and beneficial for you to take aspirin  daily. Find healthy ways to manage stress, such as: Meditation, yoga, or listening to music. Journaling. Talking to a trusted person. Spending time with friends and family. Minimize exposure to UV radiation to reduce your risk of skin cancer. Safety Always wear your seat belt while driving or riding in a vehicle. Do not drive: If you have been drinking alcohol. Do not ride with someone who has been drinking. When you are tired or distracted. While texting. If you have been using any mind-altering substances or drugs. Wear a helmet and other protective equipment during sports activities. If you have firearms in your house, make sure you follow all gun safety procedures. What's next? Go to your health care provider once a year for an annual wellness visit. Ask your health care provider how often you should have your eyes and teeth checked. Stay up to date on all vaccines. This information is not intended to replace advice given to you by your health care provider. Make sure you discuss any questions you have with your health care provider. Document Revised: 06/27/2020 Document Reviewed: 06/27/2020 Elsevier Patient Education  2024 [employer].

## 2023-12-28 NOTE — Assessment & Plan Note (Signed)
 Well-controlled on sertraline  150 mg daily.

## 2023-12-28 NOTE — Assessment & Plan Note (Signed)
 Check lipid panel

## 2023-12-28 NOTE — Assessment & Plan Note (Signed)
 BMI 34.29 today.  He does have comorbidities including dyslipidemia and meets medical indication for pharmacologic treatment for his obesity.  We tried Zepbound  last year and he had modest success with this however insurance is no longer paying for this and he would like to try alternatives.  We discussed potential treatment options including alternative GLP versus higher dose of Zepbound  as well as non-GLP medications.    Will start phentermine  30 mg daily.  We discussed potential side effects and the fact that he should only be on this for 2 to 3 months at a time.  He will follow-up with us  in a few weeks.

## 2023-12-29 ENCOUNTER — Encounter: Payer: Self-pay | Admitting: Family Medicine

## 2023-12-29 LAB — HEPATITIS C ANTIBODY: Hepatitis C Ab: NONREACTIVE

## 2023-12-29 LAB — HIV ANTIBODY (ROUTINE TESTING W REFLEX)
HIV 1&2 Ab, 4th Generation: NONREACTIVE
HIV FINAL INTERPRETATION: NEGATIVE

## 2023-12-31 ENCOUNTER — Ambulatory Visit: Payer: Self-pay | Admitting: Family Medicine

## 2023-12-31 DIAGNOSIS — E871 Hypo-osmolality and hyponatremia: Secondary | ICD-10-CM

## 2023-12-31 NOTE — Progress Notes (Signed)
 His sodium is just a little bit low.  This is probably not a significant issue however can be a side effect of a couple of his medications.  Recommend that he come back in a few weeks to recheck bmet to make sure that his sodium levels are stable.  All of his other labs are at goal and we can recheck in a year.

## 2024-01-04 ENCOUNTER — Other Ambulatory Visit: Payer: Self-pay | Admitting: *Deleted

## 2024-01-11 ENCOUNTER — Ambulatory Visit: Payer: Self-pay | Admitting: Family Medicine

## 2024-01-11 ENCOUNTER — Other Ambulatory Visit (INDEPENDENT_AMBULATORY_CARE_PROVIDER_SITE_OTHER)

## 2024-01-11 DIAGNOSIS — E871 Hypo-osmolality and hyponatremia: Secondary | ICD-10-CM

## 2024-01-11 LAB — BASIC METABOLIC PANEL WITH GFR
BUN: 13 mg/dL (ref 6–23)
CO2: 27 meq/L (ref 19–32)
Calcium: 9.3 mg/dL (ref 8.4–10.5)
Chloride: 100 meq/L (ref 96–112)
Creatinine, Ser: 0.97 mg/dL (ref 0.40–1.50)
GFR: 87.92 mL/min
Glucose, Bld: 128 mg/dL — ABNORMAL HIGH (ref 70–99)
Potassium: 4.3 meq/L (ref 3.5–5.1)
Sodium: 136 meq/L (ref 135–145)

## 2024-01-11 NOTE — Progress Notes (Signed)
 Good news! Sodium is back to normal. We can recheck everything in a year.

## 2024-01-11 NOTE — Progress Notes (Signed)
 Spoke to patient directly over the phone Patient stated understanding of results.

## 2024-01-21 ENCOUNTER — Encounter: Payer: Self-pay | Admitting: Audiology

## 2024-01-21 ENCOUNTER — Ambulatory Visit: Attending: Family Medicine | Admitting: Audiology

## 2024-01-21 DIAGNOSIS — H9313 Tinnitus, bilateral: Secondary | ICD-10-CM | POA: Insufficient documentation

## 2024-01-21 NOTE — Procedures (Signed)
" °  Outpatient Audiology and [facility] [address] [place], [place]  [address] [phone]  AUDIOLOGICAL  EVALUATION  NAME: LATRELLE BAZAR     DOB:   10/31/1970      MRN: [id number]                                                                                     DATE: 01/21/2024     REFERENT: Kennyth Worth HERO, MD STATUS: Outpatient DIAGNOSIS: tinnitus    History: Kendarrius was seen for an audiological evaluation due to concerns regarding his hearing sensitivity. Kayleb reports tinnitus occurring for a few years. Hayven reports a history of noise exposure from growing up around a building services engineer, working in event organiser, and from playing music. Mackie reports he hears his tinnitus all the time. Brylen reports a history of left ruptured eardrum 25+ years ago. Shadi denies otalgia, aural fullness, and dizziness. Blaiden reports his tinnitus is bothersome at night.   Evaluation:  Otoscopy showed a clear view of the tympanic membranes, bilaterally Tympanometry results were consistent with normal middle ear function (Type A), bilaterally.  Audiometric testing was completed using Conventional Audiometry techniques with insert earphones and TDH headphones. 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 The hearing loss configuration is suggestive of a noise notch at 4000 Hz.  Speech Recognition Thresholds were obtained at 20 dB HL in the right ear and at 15  dB HL in the left ear. Word Recognition Testing was completed at 60 dB HL and Okey scored 100% bilaterally.    Results:  The test results were reviewed with Odel. 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 The hearing loss configuration is suggestive of a noise notch at 4000 Hz.  Arihaan may have hearing and communication difficulty in adverse listening  environments. He will benefit from the use of good communication strategies. Larell's tinnitus and tinnitus management strategies were reviewed. Gradyn was given a list of information about tinnitus.   Recommendations: 1.   Use of tinnitus management strategies 2.   Use of hearing protection 3.   Monitor hearing sensitivity. Return in 2 years for audiological monitoring.    30 minutes spent testing and counseling on results.   If you have any questions please feel free to contact me at [phone].  Darryle Posey Audiologist, Au.D., CCC-A 01/21/2024  10:21 AM  Cc: Kennyth Worth HERO, MD  "

## 2024-01-26 ENCOUNTER — Other Ambulatory Visit: Payer: Self-pay | Admitting: Family Medicine

## 2024-12-28 ENCOUNTER — Encounter: Admitting: Family Medicine
